# Patient Record
Sex: Female | Born: 1935
Health system: Southern US, Community
[De-identification: ages and names within clinical notes are randomized; demographics above are authoritative.]

## PROBLEM LIST (undated history)

## (undated) DIAGNOSIS — E039 Hypothyroidism, unspecified: Secondary | ICD-10-CM

## (undated) DIAGNOSIS — G4700 Insomnia, unspecified: Secondary | ICD-10-CM

## (undated) DIAGNOSIS — M810 Age-related osteoporosis without current pathological fracture: Secondary | ICD-10-CM

## (undated) DIAGNOSIS — I10 Essential (primary) hypertension: Secondary | ICD-10-CM

## (undated) DIAGNOSIS — D649 Anemia, unspecified: Secondary | ICD-10-CM

## (undated) DIAGNOSIS — I253 Aneurysm of heart: Secondary | ICD-10-CM

## (undated) DIAGNOSIS — R7303 Prediabetes: Secondary | ICD-10-CM

## (undated) DIAGNOSIS — M1712 Unilateral primary osteoarthritis, left knee: Secondary | ICD-10-CM

## (undated) DIAGNOSIS — Z853 Personal history of malignant neoplasm of breast: Secondary | ICD-10-CM

## (undated) DIAGNOSIS — R269 Unspecified abnormalities of gait and mobility: Secondary | ICD-10-CM

## (undated) DIAGNOSIS — C50919 Malignant neoplasm of unspecified site of unspecified female breast: Principal | ICD-10-CM

## (undated) DIAGNOSIS — D34 Benign neoplasm of thyroid gland: Secondary | ICD-10-CM

## (undated) HISTORY — PX: HIP SURGERY: SHX245

## (undated) HISTORY — DX: Unspecified abnormalities of gait and mobility: R26.9

## (undated) HISTORY — DX: Benign neoplasm of thyroid gland: D34

## (undated) HISTORY — DX: Personal history of malignant neoplasm of breast: Z85.3

## (undated) HISTORY — PX: CATARACT EXTRACTION, BILATERAL: SHX1313

## (undated) HISTORY — DX: Age-related osteoporosis without current pathological fracture: M81.0

## (undated) HISTORY — DX: Hypothyroidism, unspecified: E03.9

## (undated) HISTORY — PX: BREAST SURGERY: SHX581

## (undated) HISTORY — DX: Anemia, unspecified: D64.9

## (undated) HISTORY — DX: Insomnia, unspecified: G47.00

## (undated) HISTORY — DX: Prediabetes: R73.03

## (undated) HISTORY — PX: MASTECTOMY: SHX3

## (undated) HISTORY — DX: Unilateral primary osteoarthritis, left knee: M17.12

## (undated) HISTORY — DX: Aneurysm of heart: I25.3

## (undated) HISTORY — PX: OTHER SURGICAL HISTORY: SHX169

## (undated) HISTORY — DX: Essential (primary) hypertension: I10

## (undated) HISTORY — PX: MOUTH SURGERY: SHX715

## (undated) HISTORY — DX: Malignant neoplasm of unspecified site of unspecified female breast: C50.919

---

## 1999-09-26 ENCOUNTER — Encounter: Payer: Self-pay | Admitting: Obstetrics and Gynecology

## 1999-09-26 ENCOUNTER — Encounter: Admission: RE | Admit: 1999-09-26 | Discharge: 1999-09-26 | Payer: Self-pay | Admitting: Obstetrics and Gynecology

## 2000-03-27 ENCOUNTER — Ambulatory Visit (HOSPITAL_COMMUNITY): Admission: RE | Admit: 2000-03-27 | Discharge: 2000-03-27 | Payer: Self-pay | Admitting: Gastroenterology

## 2000-03-27 ENCOUNTER — Encounter (INDEPENDENT_AMBULATORY_CARE_PROVIDER_SITE_OTHER): Payer: Self-pay | Admitting: *Deleted

## 2000-09-26 ENCOUNTER — Encounter: Payer: Self-pay | Admitting: Obstetrics and Gynecology

## 2000-09-26 ENCOUNTER — Encounter: Admission: RE | Admit: 2000-09-26 | Discharge: 2000-09-26 | Payer: Self-pay | Admitting: Obstetrics and Gynecology

## 2001-10-06 ENCOUNTER — Encounter: Payer: Self-pay | Admitting: Obstetrics and Gynecology

## 2001-10-06 ENCOUNTER — Encounter: Admission: RE | Admit: 2001-10-06 | Discharge: 2001-10-06 | Payer: Self-pay | Admitting: Obstetrics and Gynecology

## 2002-10-07 ENCOUNTER — Encounter: Payer: Self-pay | Admitting: Internal Medicine

## 2002-10-07 ENCOUNTER — Encounter: Admission: RE | Admit: 2002-10-07 | Discharge: 2002-10-07 | Payer: Self-pay | Admitting: Internal Medicine

## 2003-10-18 ENCOUNTER — Encounter: Admission: RE | Admit: 2003-10-18 | Discharge: 2003-10-18 | Payer: Self-pay | Admitting: Obstetrics and Gynecology

## 2003-10-22 ENCOUNTER — Encounter: Admission: RE | Admit: 2003-10-22 | Discharge: 2003-10-22 | Payer: Self-pay | Admitting: Obstetrics and Gynecology

## 2004-06-16 ENCOUNTER — Encounter: Admission: RE | Admit: 2004-06-16 | Discharge: 2004-06-16 | Payer: Self-pay | Admitting: Internal Medicine

## 2004-08-03 ENCOUNTER — Encounter (INDEPENDENT_AMBULATORY_CARE_PROVIDER_SITE_OTHER): Payer: Self-pay | Admitting: *Deleted

## 2004-08-03 ENCOUNTER — Encounter: Admission: RE | Admit: 2004-08-03 | Discharge: 2004-08-03 | Payer: Self-pay | Admitting: Obstetrics and Gynecology

## 2004-08-03 ENCOUNTER — Encounter (INDEPENDENT_AMBULATORY_CARE_PROVIDER_SITE_OTHER): Payer: Self-pay | Admitting: Diagnostic Radiology

## 2004-08-17 ENCOUNTER — Encounter: Admission: RE | Admit: 2004-08-17 | Discharge: 2004-08-17 | Payer: Self-pay

## 2004-10-08 DIAGNOSIS — C50919 Malignant neoplasm of unspecified site of unspecified female breast: Secondary | ICD-10-CM

## 2004-10-08 DIAGNOSIS — Z853 Personal history of malignant neoplasm of breast: Secondary | ICD-10-CM

## 2004-10-08 HISTORY — DX: Personal history of malignant neoplasm of breast: Z85.3

## 2004-10-08 HISTORY — DX: Malignant neoplasm of unspecified site of unspecified female breast: C50.919

## 2005-03-27 ENCOUNTER — Ambulatory Visit (HOSPITAL_COMMUNITY): Admission: RE | Admit: 2005-03-27 | Discharge: 2005-03-27 | Payer: Self-pay | Admitting: Gastroenterology

## 2005-03-27 ENCOUNTER — Encounter (INDEPENDENT_AMBULATORY_CARE_PROVIDER_SITE_OTHER): Payer: Self-pay | Admitting: *Deleted

## 2007-08-06 ENCOUNTER — Encounter: Admission: RE | Admit: 2007-08-06 | Discharge: 2007-08-06 | Payer: Self-pay | Admitting: Internal Medicine

## 2007-10-09 DIAGNOSIS — E039 Hypothyroidism, unspecified: Secondary | ICD-10-CM

## 2007-10-09 DIAGNOSIS — D34 Benign neoplasm of thyroid gland: Secondary | ICD-10-CM

## 2007-10-09 HISTORY — DX: Hypothyroidism, unspecified: E03.9

## 2007-10-09 HISTORY — DX: Benign neoplasm of thyroid gland: D34

## 2008-04-22 ENCOUNTER — Encounter: Admission: RE | Admit: 2008-04-22 | Discharge: 2008-04-22 | Payer: Self-pay | Admitting: Internal Medicine

## 2008-06-15 ENCOUNTER — Other Ambulatory Visit: Admission: RE | Admit: 2008-06-15 | Discharge: 2008-06-15 | Payer: Self-pay | Admitting: Interventional Radiology

## 2008-06-15 ENCOUNTER — Encounter (INDEPENDENT_AMBULATORY_CARE_PROVIDER_SITE_OTHER): Payer: Self-pay | Admitting: Interventional Radiology

## 2008-06-15 ENCOUNTER — Encounter: Admission: RE | Admit: 2008-06-15 | Discharge: 2008-06-15 | Payer: Self-pay | Admitting: Endocrinology

## 2008-10-13 ENCOUNTER — Encounter: Admission: RE | Admit: 2008-10-13 | Discharge: 2008-10-13 | Payer: Self-pay | Admitting: Endocrinology

## 2009-10-12 ENCOUNTER — Encounter: Admission: RE | Admit: 2009-10-12 | Discharge: 2009-10-12 | Payer: Self-pay | Admitting: Endocrinology

## 2010-10-28 ENCOUNTER — Encounter: Payer: Self-pay | Admitting: Surgery

## 2010-11-22 ENCOUNTER — Other Ambulatory Visit: Payer: Self-pay | Admitting: Endocrinology

## 2010-11-22 DIAGNOSIS — E049 Nontoxic goiter, unspecified: Secondary | ICD-10-CM

## 2010-11-29 ENCOUNTER — Ambulatory Visit
Admission: RE | Admit: 2010-11-29 | Discharge: 2010-11-29 | Disposition: A | Discharge status: Home / Self Care | Payer: BC Managed Care – PPO | Source: Ambulatory Visit | Attending: Endocrinology | Admitting: Endocrinology

## 2010-11-29 DIAGNOSIS — E049 Nontoxic goiter, unspecified: Secondary | ICD-10-CM

## 2011-02-23 NOTE — Procedures (Signed)
[facility]  Patient:    Gwendolyn Rowe, Gwendolyn Rowe                         MRN: [id number] Proc. Date: 03/27/00 Adm. Date:  [id number] Disc. Date: [id number] Attending:  Nelda Marseille CC:         Petra Kuba, M.D.             Soyla Murphy. Renne Crigler, M.D.             Katherine Roan, M.D.                           Procedure Report  PROCEDURE:  Colonoscopy with biopsy.  INDICATIONS:  Patient with a family history of colon cancer due for colonic screening.  INFORMED CONSENT:  Consent was signed after risk, benefits, methods and options were thoroughly discussed multiple times in the past.  MEDICINES USED:  Demerol 70 mg, Versed 6 mg.  DESCRIPTION OF PROCEDURE:  Rectal inspection is pertinent for small external hemorrhoids.  Digital examination was negative.  The video pediatric colonoscope was inserted, and with some difficulty due to a tortuous colon we were able to advance to the cecum.  This did require some abdominal pressure but no position changes.  The cecum was identified by the appendiceal orifice and the ileocecal valve.  In fact the scope was inserted a shot ways in the terminal which was normal.  Further augmentation was obtained.  The scope was slowly withdrawn.  The prep was adequate.  There was some liquid stool and that required washing and suctioning.  On slow withdrawal through the colon other than a tiny questionable polyp at around the proximal level of the hepatic flexure which was cold biopsied x 1, no other abnormalities were seen as we slowly withdrew to the rectum.  When we fell back around a tortuous loop, we did try to re-advance around the curves to try to decrease the chance of missing things.  The most tortuous areas were at the flexures, mid transverse and the splenic descending junction.  Once back in the rectum the scope was in retroflex pertinent for some internal hemorrhoids.  The scope was straightened, readvanced a short  way up the sigmoid.  Air was suctioned and scope removed.  The patient tolerated the procedure well.  There was no obvious immediate complications.  ENDOSCOPIC DIAGNOSIS: 1. Internal and external hemorrhoids. 2. Tortuous colon. 3. Questionable tiny hepatic flexure polyp, status post cold biopsy. 4. Otherwise within normal limits to the terminal ileum.  PLAN:  Yearly rectal and guaiacs per Dr. Renne Crigler or Elana Alm.  Happy to see back sooner, p.r.n., otherwise repeat screening in five year.  May consider a one time BE or a virtual colonoscopy if available or even the video capsule if available based on her tortuosity, otherwise would proceed with commissural screening. DD:  03/27/00 TD:  03/30/00 Job: [id number]

## 2011-02-23 NOTE — Op Note (Signed)
Gwendolyn Rowe, Gwendolyn Rowe                ACCOUNT NO.:  [id number]   MEDICAL RECORD NO.:  [id number]          PATIENT TYPE:  AMB   LOCATION:  ENDO                         FACILITY:  [facility]   PHYSICIAN:  Petra Kuba, M.D.    DATE OF BIRTH:  [birth year]   DATE OF PROCEDURE:  03/27/2005  DATE OF DISCHARGE:                                 OPERATIVE REPORT   PROCEDURE:  Colonoscopy with hot biopsy.   INDICATIONS FOR PROCEDURE:  Family history of colon cancer, due for repeat  screening.  Consent was signed after risks, benefits and options thoroughly  discussed in the office in the past.   MEDICATIONS:  Demerol 60, Versed 8.   DESCRIPTION OF PROCEDURE:  Rectal inspection is pertinent for external  hemorrhoids, small.  Digital exam was negative.  Video pediatric adjustable  colonoscope was inserted, in spite of tortuous colon with abdominal pressure  was able to be advanced to the cecum.  No abnormalities were seen on  insertion.  Cecum was identified by the appendiceal orifice and ileocecal  valve.  Scope was slowly withdrawn.  The prep was adequate.  There was some  liquid stool that required washing and suctioning.  On slow withdrawal  through the colon other than a tortuous colon.  No abnormalities were seen  as we slowly withdrew back to the rectum.  In the proximal rectum, a tiny  polyp was seen and was hot biopsied x2 and put in a container.  Anorectal  pull through and retroflexion in the rectum confirmed some small  hemorrhoids.  Scope was straightened and readvanced a short ways up the left  side of the colon.  Air was suctioned and scope removed.  The patient  tolerated the procedure well.  There was no obvious immediate complications.   ENDOSCOPIC DIAGNOSES:  1.  Internal and external small hemorrhoids.  2.  Tiny rectal polyp hot biopsied.  3.  Otherwise within normal limits to the cecum.   PLAN:  Await pathology, probably recheck colon screening in five years.  If  widely  available and covered by insurance, a one time virtual colonoscopy at  that junction may be indicated and I would be happy to see back p.r.n.,  otherwise return care to Dr. Renne Crigler and Dr. Elana Alm for the customary  healthcare maintenance to include yearly rectals and guaiacs.       MEM/MEDQ  D:  03/27/2005  T:  03/27/2005  Job:  [id number]   cc:   Soyla Murphy. Renne Crigler, M.D.  [address]  [place]  [place] [address]  Fax: [phone]

## 2011-06-21 ENCOUNTER — Other Ambulatory Visit (HOSPITAL_COMMUNITY): Payer: Self-pay | Admitting: Internal Medicine

## 2011-06-21 DIAGNOSIS — Z1231 Encounter for screening mammogram for malignant neoplasm of breast: Secondary | ICD-10-CM

## 2011-07-02 ENCOUNTER — Other Ambulatory Visit (HOSPITAL_COMMUNITY): Payer: Self-pay | Admitting: Internal Medicine

## 2011-07-02 ENCOUNTER — Ambulatory Visit (HOSPITAL_COMMUNITY)
Admission: RE | Admit: 2011-07-02 | Discharge: 2011-07-02 | Disposition: A | Discharge status: Home / Self Care | Payer: Medicare Other | Source: Ambulatory Visit | Attending: Internal Medicine | Admitting: Internal Medicine

## 2011-07-02 DIAGNOSIS — Z1231 Encounter for screening mammogram for malignant neoplasm of breast: Secondary | ICD-10-CM

## 2011-07-31 ENCOUNTER — Other Ambulatory Visit: Payer: Self-pay | Admitting: Internal Medicine

## 2011-07-31 DIAGNOSIS — E041 Nontoxic single thyroid nodule: Secondary | ICD-10-CM

## 2011-11-12 ENCOUNTER — Ambulatory Visit
Admission: RE | Admit: 2011-11-12 | Discharge: 2011-11-12 | Disposition: A | Discharge status: Home / Self Care | Payer: Medicare Other | Source: Ambulatory Visit | Attending: Internal Medicine | Admitting: Internal Medicine

## 2011-11-12 DIAGNOSIS — E041 Nontoxic single thyroid nodule: Secondary | ICD-10-CM

## 2011-11-19 ENCOUNTER — Other Ambulatory Visit: Payer: BC Managed Care – PPO

## 2011-12-03 ENCOUNTER — Other Ambulatory Visit: Payer: BC Managed Care – PPO

## 2012-05-28 ENCOUNTER — Other Ambulatory Visit (HOSPITAL_COMMUNITY): Payer: Self-pay | Admitting: Internal Medicine

## 2012-05-28 DIAGNOSIS — Z1231 Encounter for screening mammogram for malignant neoplasm of breast: Secondary | ICD-10-CM

## 2012-07-03 ENCOUNTER — Other Ambulatory Visit (HOSPITAL_COMMUNITY): Payer: Self-pay | Admitting: Internal Medicine

## 2012-07-03 ENCOUNTER — Ambulatory Visit (HOSPITAL_COMMUNITY)
Admission: RE | Admit: 2012-07-03 | Discharge: 2012-07-03 | Disposition: A | Discharge status: Home / Self Care | Payer: Medicare Other | Source: Ambulatory Visit | Attending: Internal Medicine | Admitting: Internal Medicine

## 2012-07-03 DIAGNOSIS — Z1231 Encounter for screening mammogram for malignant neoplasm of breast: Secondary | ICD-10-CM | POA: Insufficient documentation

## 2012-07-17 ENCOUNTER — Encounter: Payer: Self-pay | Admitting: Gynecology

## 2012-07-17 ENCOUNTER — Ambulatory Visit (INDEPENDENT_AMBULATORY_CARE_PROVIDER_SITE_OTHER): Payer: Medicare Other | Admitting: Gynecology

## 2012-07-17 VITALS — BP 110/70 | Ht 66.0 in | Wt 144.0 lb

## 2012-07-17 DIAGNOSIS — M810 Age-related osteoporosis without current pathological fracture: Secondary | ICD-10-CM

## 2012-07-17 DIAGNOSIS — C50919 Malignant neoplasm of unspecified site of unspecified female breast: Secondary | ICD-10-CM

## 2012-07-17 DIAGNOSIS — N952 Postmenopausal atrophic vaginitis: Secondary | ICD-10-CM

## 2012-07-17 NOTE — Progress Notes (Signed)
Gwendolyn Rowe [birth year] [id number]        78 y.o.  G2P2 for follow up exam.  Several issues noted below.  Past medical history,surgical history, medications, allergies, family history and social history were all reviewed and documented in the EPIC chart. ROS:  Was performed and pertinent positives and negatives are included in the history.  Exam: Sherrilyn Rist assistant Filed Vitals:   07/17/12 1413  BP: 110/70  Height: 5\' 6"  (1.676 m)  Weight: 144 lb (65.318 kg)   General appearance  Normal Skin grossly normal Head/Neck normal with no cervical or supraclavicular adenopathy thyroid normal Lungs  clear Cardiac RR, without RMG Abdominal  soft, nontender, without masses, organomegaly or hernia Left Breast  examined lying and sitting without masses, retractions, discharge or axillary adenopathy. Right chest wall status post mastectomy with well-healed scars. No masses retractions adenopathy. Pelvic  Ext/BUS/vagina  normal atrophic genital changes  Cervix  normal atrophic  Uterus  axial, normal size, shape and contour, midline and mobile nontender   Adnexa  Without masses or tenderness    Anus and perineum  normal   Rectovaginal  normal sphincter tone without palpated masses or tenderness.    Assessment/Plan:  78 y.o. G2P2 female for follow up exam.   1. Breast cancer. Status post right mastectomy. Doing well with normal exam. Last mammogram 9 2013.  Continue with screening mammography. SBE monthly reviewed. 2. Atrophic vaginitis. Patient asymptomatic. We'll continue to monitor. 3. Pap smear. No Pap smear done today.  Last Pap smear 2012. No history of abnormal Pap smears before. I reviewed her chart that she brought with her from Dr. Elana Alm and she is always had normal Pap smears. Discussed current screening guidelines and will stop screening. 4. Colonoscopy. Patient had colonoscopy 2011. His to follow up at 5 year interval. 5. Osteoporosis. Had been on Fosamax previously and then  subsequently [facility] for 2 years. Had DEXA earlier this year and follows up with Dr. Renne Crigler in reference to this. 6. Health maintenance. Reviewed records from Dr. Lelon Perla that she brought with her and returned them to the patient to store.  No lab work done today as is all done through Dr. Carolee Rota office who follows her for her hypertension and hypothyroidism.  Follow up one year, sooner as needed.    Dara Lords MD, 2:59 PM 07/17/2012

## 2012-07-17 NOTE — Patient Instructions (Signed)
Follow up in one year for annual exam 

## 2012-11-19 ENCOUNTER — Other Ambulatory Visit: Payer: Self-pay | Admitting: Endocrinology

## 2012-11-19 DIAGNOSIS — E049 Nontoxic goiter, unspecified: Secondary | ICD-10-CM

## 2012-11-22 ENCOUNTER — Other Ambulatory Visit: Payer: Self-pay

## 2012-11-26 ENCOUNTER — Ambulatory Visit
Admission: RE | Admit: 2012-11-26 | Discharge: 2012-11-26 | Disposition: A | Discharge status: Home / Self Care | Payer: Medicare Other | Source: Ambulatory Visit | Attending: Endocrinology | Admitting: Endocrinology

## 2012-11-26 DIAGNOSIS — E049 Nontoxic goiter, unspecified: Secondary | ICD-10-CM

## 2013-06-01 ENCOUNTER — Other Ambulatory Visit (HOSPITAL_COMMUNITY): Payer: Self-pay | Admitting: Internal Medicine

## 2013-06-01 DIAGNOSIS — Z1231 Encounter for screening mammogram for malignant neoplasm of breast: Secondary | ICD-10-CM

## 2013-07-06 ENCOUNTER — Ambulatory Visit (HOSPITAL_COMMUNITY)
Admission: RE | Admit: 2013-07-06 | Discharge: 2013-07-06 | Disposition: A | Discharge status: Home / Self Care | Payer: Medicare Other | Source: Ambulatory Visit | Attending: Internal Medicine | Admitting: Internal Medicine

## 2013-07-06 ENCOUNTER — Other Ambulatory Visit (HOSPITAL_COMMUNITY): Payer: Self-pay | Admitting: Internal Medicine

## 2013-07-06 DIAGNOSIS — Z1231 Encounter for screening mammogram for malignant neoplasm of breast: Secondary | ICD-10-CM | POA: Insufficient documentation

## 2013-07-21 ENCOUNTER — Encounter: Payer: Self-pay | Admitting: Gynecology

## 2013-07-21 ENCOUNTER — Ambulatory Visit (INDEPENDENT_AMBULATORY_CARE_PROVIDER_SITE_OTHER): Payer: Medicare Other | Admitting: Gynecology

## 2013-07-21 VITALS — BP 130/84 | Ht 66.0 in | Wt 142.0 lb

## 2013-07-21 DIAGNOSIS — N952 Postmenopausal atrophic vaginitis: Secondary | ICD-10-CM

## 2013-07-21 DIAGNOSIS — M810 Age-related osteoporosis without current pathological fracture: Secondary | ICD-10-CM

## 2013-07-21 NOTE — Progress Notes (Signed)
Gwendolyn Rowe [birth year] [id number]        79 y.o.  G2P2002 for followup exam.  Several issues noted below.  Past medical history,surgical history, medications, allergies, family history and social history were all reviewed and documented in the EPIC chart.  ROS:  Performed and pertinent positives and negatives are included in the history, assessment and plan .  Exam: Kim assistant Filed Vitals:   07/21/13 0903  BP: 130/84  Height: 5\' 6"  (1.676 m)  Weight: 142 lb (64.411 kg)   General appearance  Normal Skin grossly normal Head/Neck normal with no cervical or supraclavicular adenopathy thyroid normal Lungs  clear Cardiac RR, without RMG Abdominal  soft, nontender, without masses, organomegaly or hernia Breast/chest wall  examined lying and sitting. Left without masses, retractions, discharge or axillary adenopathy. Right status post mastectomy without masses, adenopathy or acute changes. Pelvic  Ext/BUS/vagina  normal with atrophic changes  Cervix  normal with atrophic changes  Uterus  anteverted, normal size, shape and contour, midline and mobile nontender   Adnexa  Without masses or tenderness    Anus and perineum  normal   Rectovaginal  normal sphincter tone without palpated masses or tenderness.    Assessment/Plan:  79 y.o. G73P2002 female for annual exam.   1. Postmenopausal/atrophic genital changes. Patient without symptoms and is not currently sexually active. Will continue to monitor. 2. Breast cancer 2006 status post right mastectomy. NED. Mammography 06/2013 Continue with annual mammography. SBE monthly reviewed. 3. Pap smear 2012. No Pap smear done today. No history of abnormal Pap smears previously. Per current screening guidelines we've elected to stop screening and she is comfortable with this. 4. Osteoporosis. Actively being followed by Dr. Renne Crigler status post course of Forteo. Most recent DEXA 12/2012. We'll continue to follow up with Dr. Renne Crigler reference to  this. 5. Colonoscopy 2010. Repeat at their recommended interval. 6. Health maintenance. No lab work done as it is all done through her primary physician's office. Followup one year, sooner as needed.  Note: This document was prepared with digital dictation and possible smart phrase technology. Any transcriptional errors that result from this process are unintentional.   Dara Lords MD, 9:37 AM 07/21/2013

## 2013-07-21 NOTE — Patient Instructions (Signed)
Follow up in one year, sooner as needed. 

## 2013-08-19 ENCOUNTER — Ambulatory Visit (INDEPENDENT_AMBULATORY_CARE_PROVIDER_SITE_OTHER): Payer: Medicare Other | Admitting: Sports Medicine

## 2013-08-19 ENCOUNTER — Encounter: Payer: Self-pay | Admitting: Sports Medicine

## 2013-08-19 VITALS — BP 163/89 | Ht 66.0 in | Wt 142.0 lb

## 2013-08-19 DIAGNOSIS — M25569 Pain in unspecified knee: Secondary | ICD-10-CM

## 2013-08-19 DIAGNOSIS — M25469 Effusion, unspecified knee: Secondary | ICD-10-CM

## 2013-08-19 DIAGNOSIS — M25462 Effusion, left knee: Secondary | ICD-10-CM

## 2013-08-19 DIAGNOSIS — M4120 Other idiopathic scoliosis, site unspecified: Secondary | ICD-10-CM | POA: Insufficient documentation

## 2013-08-19 DIAGNOSIS — I10 Essential (primary) hypertension: Secondary | ICD-10-CM | POA: Insufficient documentation

## 2013-08-19 DIAGNOSIS — M2170 Unequal limb length (acquired), unspecified site: Secondary | ICD-10-CM | POA: Insufficient documentation

## 2013-08-19 DIAGNOSIS — G8929 Other chronic pain: Secondary | ICD-10-CM

## 2013-08-19 DIAGNOSIS — E039 Hypothyroidism, unspecified: Secondary | ICD-10-CM | POA: Insufficient documentation

## 2013-08-19 DIAGNOSIS — R7303 Prediabetes: Secondary | ICD-10-CM | POA: Insufficient documentation

## 2013-08-19 DIAGNOSIS — M25562 Pain in left knee: Secondary | ICD-10-CM | POA: Insufficient documentation

## 2013-08-19 MED ORDER — MELOXICAM 15 MG PO TABS: 15.0000 mg | ORAL_TABLET | Freq: Every day | ORAL | Status: DC | PRN

## 2013-08-19 NOTE — Patient Instructions (Signed)
Thank you for coming in today  Try knee compression sleeve during exercise and for 30-60 minutes after Do hip exercises daily Ice the knee after walking Try the heel lift in your shoes   Followup 1 month

## 2013-08-19 NOTE — Progress Notes (Signed)
CC: Left knee pain HPI: Patient is a very pleasant 79 year old female who is much younger than stated age who presents for evaluation of left knee pain that has been present for several months. She exercises been better for the last 3 weeks. Pain is over the medial aspect of her knee and is largely sore. Sometimes the pain gets worse. She feels very stiff after she has been sitting for while and has difficulty getting moving. She denies any swelling in the knee. Sometimes she feels like she gets a cramp in her lower leg. Her pain worsens after she walks for exercise and also a squats and lunges in stairs. She does okay on the elliptical and biking. She has been to have her Profen 600 mg daily. She also uses ice as needed and frequently uses heat. She finds that he is very helpful. She does have a history of scoliosis.  ROS: As above in the HPI. All other systems are stable or negative.  PMH: Hypothyroidism, high blood pressure, prediabetes, scoliosis  Social: Patient is retired. She does not smoke or drink alcohol. Family: Brother with diabetes. Mother and father with high blood pressure.  Allergies: No known drug allergies    OBJECTIVE: APPEARANCE:  Patient in no acute distress.The patient appeared well nourished and normally developed. HEENT: No scleral icterus. Conjunctiva non-injected Resp: Non labored Skin: No rash MSK:  Left Knee - Inspection normal with no erythema or effusion or obvious bony abnormalities.  - Palpation with significant tenderness at medial joint line  - ROM normal in flexion and extension. - Strength 5/5 in flexion and extension. - Ligaments with solid consistent endpoints including ACL, PCL, LCL, MCL.  - Negative Mcmurray's.  - Non painful patellar compression.  - Neurovascularly intact  - Trendelenburg gait - Leg length discrepancy with 2 cm shortening on the right - weakness on hip abduction and extension  MSK [place]: Ultrasound of the right knee was  performed in transverse and longitudinal views. Quad tendon normal in appearance. There is excessive hypoechoic signal suggestive of effusion in the suprapatellar pouch. Patellar tendon normal in appearance. Lateral joint line with some narrowing and hyperechoic calcification suggestive of osteophytes. Lateral meniscus normal in appearance. Medial joint line shows narrowing and irregularity as well as hyperechoic calcifications consistent with osteophytes. She does have an extrusion of her medial meniscus at the midportion of the medial joint line  Radiographs: Outside two-view standing x-ray of the left knee was personally reviewed today. This shows mild joint space narrowing in the medial joint line  ASSESSMENT: #1. Left knee pain secondary to degenerative tear of the medial meniscus as well as mild osteoarthritis and hip abductor weakness  PLAN: Patient was given a  Body helix knee compression sleeve to wear during and for 30 minutes after exercise. We gave her a heel lift to at her right shoe to help she was given a set of home exercises including hip abduction, hip extension, and standing hip rotation to help resolve her hip abductor and rotator weakness. She will followup with [place] in one month.

## 2013-09-17 ENCOUNTER — Ambulatory Visit: Payer: Medicare Other | Admitting: Sports Medicine

## 2013-11-20 ENCOUNTER — Other Ambulatory Visit: Payer: Self-pay | Admitting: Endocrinology

## 2013-11-20 DIAGNOSIS — E049 Nontoxic goiter, unspecified: Secondary | ICD-10-CM

## 2013-12-02 ENCOUNTER — Other Ambulatory Visit: Payer: Medicare Other

## 2014-01-07 ENCOUNTER — Other Ambulatory Visit (HOSPITAL_COMMUNITY): Payer: Self-pay | Admitting: Family Medicine

## 2014-02-15 ENCOUNTER — Other Ambulatory Visit: Payer: Medicare Other

## 2014-03-02 ENCOUNTER — Other Ambulatory Visit: Payer: Medicare Other

## 2014-03-02 ENCOUNTER — Ambulatory Visit: Payer: Medicare Other | Attending: Internal Medicine

## 2014-03-02 DIAGNOSIS — IMO0001 Reserved for inherently not codable concepts without codable children: Secondary | ICD-10-CM | POA: Insufficient documentation

## 2014-03-02 DIAGNOSIS — R5381 Other malaise: Secondary | ICD-10-CM | POA: Insufficient documentation

## 2014-03-02 DIAGNOSIS — M25539 Pain in unspecified wrist: Secondary | ICD-10-CM | POA: Insufficient documentation

## 2014-03-02 DIAGNOSIS — M25639 Stiffness of unspecified wrist, not elsewhere classified: Secondary | ICD-10-CM | POA: Insufficient documentation

## 2014-03-02 DIAGNOSIS — R262 Difficulty in walking, not elsewhere classified: Secondary | ICD-10-CM | POA: Insufficient documentation

## 2014-03-02 DIAGNOSIS — M25659 Stiffness of unspecified hip, not elsewhere classified: Secondary | ICD-10-CM | POA: Insufficient documentation

## 2014-03-03 ENCOUNTER — Ambulatory Visit: Payer: Medicare Other

## 2014-03-08 ENCOUNTER — Ambulatory Visit: Payer: Medicare Other | Attending: Internal Medicine

## 2014-03-08 DIAGNOSIS — R262 Difficulty in walking, not elsewhere classified: Secondary | ICD-10-CM | POA: Insufficient documentation

## 2014-03-08 DIAGNOSIS — R5381 Other malaise: Secondary | ICD-10-CM | POA: Insufficient documentation

## 2014-03-08 DIAGNOSIS — Z5189 Encounter for other specified aftercare: Secondary | ICD-10-CM | POA: Insufficient documentation

## 2014-03-08 DIAGNOSIS — M25639 Stiffness of unspecified wrist, not elsewhere classified: Secondary | ICD-10-CM | POA: Insufficient documentation

## 2014-03-10 ENCOUNTER — Ambulatory Visit: Payer: Medicare Other

## 2014-03-15 ENCOUNTER — Ambulatory Visit: Payer: Medicare Other

## 2014-03-17 ENCOUNTER — Ambulatory Visit: Payer: Medicare Other

## 2014-03-18 ENCOUNTER — Ambulatory Visit
Admission: RE | Admit: 2014-03-18 | Discharge: 2014-03-18 | Disposition: A | Discharge status: Home / Self Care | Payer: Medicare Other | Source: Ambulatory Visit | Attending: Endocrinology | Admitting: Endocrinology

## 2014-03-18 DIAGNOSIS — E049 Nontoxic goiter, unspecified: Secondary | ICD-10-CM

## 2014-03-19 ENCOUNTER — Ambulatory Visit: Payer: Medicare Other

## 2014-03-22 ENCOUNTER — Ambulatory Visit: Payer: Medicare Other

## 2014-03-24 ENCOUNTER — Ambulatory Visit: Payer: Medicare Other

## 2014-03-31 ENCOUNTER — Ambulatory Visit: Payer: Medicare Other

## 2014-04-06 ENCOUNTER — Ambulatory Visit: Payer: Medicare Other

## 2014-07-08 ENCOUNTER — Other Ambulatory Visit (HOSPITAL_COMMUNITY): Payer: Self-pay | Admitting: Internal Medicine

## 2014-07-08 DIAGNOSIS — Z1231 Encounter for screening mammogram for malignant neoplasm of breast: Secondary | ICD-10-CM

## 2014-07-13 ENCOUNTER — Other Ambulatory Visit (HOSPITAL_COMMUNITY): Payer: Self-pay | Admitting: Internal Medicine

## 2014-07-13 ENCOUNTER — Ambulatory Visit (HOSPITAL_COMMUNITY)
Admission: RE | Admit: 2014-07-13 | Discharge: 2014-07-13 | Disposition: A | Discharge status: Home / Self Care | Payer: Medicare Other | Source: Ambulatory Visit | Attending: Internal Medicine | Admitting: Internal Medicine

## 2014-07-13 DIAGNOSIS — Z1231 Encounter for screening mammogram for malignant neoplasm of breast: Secondary | ICD-10-CM | POA: Diagnosis not present

## 2014-07-22 ENCOUNTER — Encounter: Payer: Medicare Other | Admitting: Gynecology

## 2014-07-26 ENCOUNTER — Ambulatory Visit (INDEPENDENT_AMBULATORY_CARE_PROVIDER_SITE_OTHER): Payer: Medicare Other | Admitting: Gynecology

## 2014-07-26 ENCOUNTER — Encounter: Payer: Self-pay | Admitting: Gynecology

## 2014-07-26 VITALS — BP 120/78 | Ht 67.0 in | Wt 144.0 lb

## 2014-07-26 DIAGNOSIS — M810 Age-related osteoporosis without current pathological fracture: Secondary | ICD-10-CM

## 2014-07-26 DIAGNOSIS — N952 Postmenopausal atrophic vaginitis: Secondary | ICD-10-CM

## 2014-07-26 DIAGNOSIS — C50911 Malignant neoplasm of unspecified site of right female breast: Secondary | ICD-10-CM

## 2014-07-26 DIAGNOSIS — Z23 Encounter for immunization: Secondary | ICD-10-CM

## 2014-07-26 NOTE — Addendum Note (Signed)
Addended by: Nelva Nay on: 07/26/2014 11:03 AM   Modules accepted: Orders

## 2014-07-26 NOTE — Progress Notes (Signed)
PAMELYN BANCROFT 02-Aug-1934 [id number]        80 y.o.  G2P2002 for follow up exam. Several issues noted below.  Past medical history,surgical history, problem list, medications, allergies, family history and social history were all reviewed and documented as reviewed in the EPIC chart.  ROS:  12 system ROS performed with pertinent positives and negatives included in the history, assessment and plan.   Additional significant findings :  none   Exam: Kim Counsellor Vitals:   07/26/14 0916  BP: 120/78  Height: 5\' 7"  (1.702 m)  Weight: 144 lb (65.318 kg)   General appearance:  Normal affect, orientation and appearance. Skin: Grossly normal HEENT: Without gross lesions.  No cervical or supraclavicular adenopathy. Thyroid normal.  Lungs:  Clear without wheezing, rales or rhonchi Cardiac: RR, without RMG Abdominal:  Soft, nontender, without masses, guarding, rebound, organomegaly or hernia Breasts:  Examined lying and sitting. Left without masses, retractions, discharge or axillary adenopathy.  Right chest wall without masses or axillary adenopathy status post mastectomy with well healed scars. Pelvic:  Ext/BUS/vagina with generalized atrophic changes  Cervix with atrophic changes  Uterus anteverted, normal size, shape and contour, midline and mobile nontender   Adnexa  Without masses or tenderness    Anus and perineum  Normal   Rectovaginal  Normal sphincter tone without palpated masses or tenderness.    Assessment/Plan:  80 y.o. [id number] female for annual exam.   1. Postmenopausal/atrophic genital changes. Patient without symptoms such as hot flashes, night sweats, vaginal dryness. Is not sexually active. No vaginal bleeding. Continue to monitor. Report any vaginal bleeding. 2. Osteoporosis. Recent DEXA at Dr. Pennie Banter reportedly shows some decline in she has an appointment this week to discuss treatment options with him. Previously been on Forteo for 2 years. We'll follow up with him  in reference to this and his recommendations. 3. Breast cancer 2006 status post right mastectomy. Exam NED.  Mammography 07/2014. Continue with annual mammography. SBE monthly reviewed. 4. Colonoscopy 6-7 years ago. Repeat at their recommended interval. Will follow with Dr. Shelia Media for his recommendations. 5. Pap smear 2009. No Pap smear done today. No history of significant abnormal Pap smears. Plan no further screening per current screening guidelines she is over the age of 35 and comfortable with this. 6. Health maintenance. No routine lab work done as this is all done at Dr. Pennie Banter office. Follow up one year, sooner as needed.     Anastasio Auerbach MD, 10:17 AM 07/26/2014

## 2014-07-26 NOTE — Patient Instructions (Signed)
You may obtain a copy of any labs that were done today by logging onto MyChart as outlined in the instructions provided with your AVS (after visit summary). The office will not call with normal lab results but certainly if there are any significant abnormalities then we will contact you.   Health Maintenance, Female A healthy lifestyle and preventative care can promote health and wellness.  Maintain regular health, dental, and eye exams.  Eat a healthy diet. Foods like vegetables, fruits, whole grains, low-fat dairy products, and lean protein foods contain the nutrients you need without too many calories. Decrease your intake of foods high in solid fats, added sugars, and salt. Get information about a proper diet from your caregiver, if necessary.  Regular physical exercise is one of the most important things you can do for your health. Most adults should get at least 150 minutes of moderate-intensity exercise (any activity that increases your heart rate and causes you to sweat) each week. In addition, most adults need muscle-strengthening exercises on 2 or more days a week.   Maintain a healthy weight. The body mass index (BMI) is a screening tool to identify possible weight problems. It provides an estimate of body fat based on height and weight. Your caregiver can help determine your BMI, and can help you achieve or maintain a healthy weight. For adults 20 years and older:  A BMI below 18.5 is considered underweight.  A BMI of 18.5 to 24.9 is normal.  A BMI of 25 to 29.9 is considered overweight.  A BMI of 30 and above is considered obese.  Maintain normal blood lipids and cholesterol by exercising and minimizing your intake of saturated fat. Eat a balanced diet with plenty of fruits and vegetables. Blood tests for lipids and cholesterol should begin at age 61 and be repeated every 5 years. If your lipid or cholesterol levels are high, you are over 50, or you are a high risk for heart  disease, you may need your cholesterol levels checked more frequently.Ongoing high lipid and cholesterol levels should be treated with medicines if diet and exercise are not effective.  If you smoke, find out from your caregiver how to quit. If you do not use tobacco, do not start.  Lung cancer screening is recommended for adults aged 33 80 years who are at high risk for developing lung cancer because of a history of smoking. Yearly low-dose computed tomography (CT) is recommended for people who have at least a 30-pack-year history of smoking and are a current smoker or have quit within the past 15 years. A pack year of smoking is smoking an average of 1 pack of cigarettes a day for 1 year (for example: 1 pack a day for 30 years or 2 packs a day for 15 years). Yearly screening should continue until the smoker has stopped smoking for at least 15 years. Yearly screening should also be stopped for people who develop a health problem that would prevent them from having lung cancer treatment.  If you are pregnant, do not drink alcohol. If you are breastfeeding, be very cautious about drinking alcohol. If you are not pregnant and choose to drink alcohol, do not exceed 1 drink per day. One drink is considered to be 12 ounces (355 mL) of beer, 5 ounces (148 mL) of wine, or 1.5 ounces (44 mL) of liquor.  Avoid use of street drugs. Do not share needles with anyone. Ask for help if you need support or instructions about stopping  the use of drugs.  High blood pressure causes heart disease and increases the risk of stroke. Blood pressure should be checked at least every 1 to 2 years. Ongoing high blood pressure should be treated with medicines, if weight loss and exercise are not effective.  If you are 59 to 78 years old, ask your caregiver if you should take aspirin to prevent strokes.  Diabetes screening involves taking a blood sample to check your fasting blood sugar level. This should be done once every 3  years, after age 91, if you are within normal weight and without risk factors for diabetes. Testing should be considered at a younger age or be carried out more frequently if you are overweight and have at least 1 risk factor for diabetes.  Breast cancer screening is essential preventative care for women. You should practice "breast self-awareness." This means understanding the normal appearance and feel of your breasts and may include breast self-examination. Any changes detected, no matter how small, should be reported to a caregiver. Women in their 66s and 30s should have a clinical breast exam (CBE) by a caregiver as part of a regular health exam every 1 to 3 years. After age 101, women should have a CBE every year. Starting at age 100, women should consider having a mammogram (breast X-ray) every year. Women who have a family history of breast cancer should talk to their caregiver about genetic screening. Women at a high risk of breast cancer should talk to their caregiver about having an MRI and a mammogram every year.  Breast cancer gene (BRCA)-related cancer risk assessment is recommended for women who have family members with BRCA-related cancers. BRCA-related cancers include breast, ovarian, tubal, and peritoneal cancers. Having family members with these cancers may be associated with an increased risk for harmful changes (mutations) in the breast cancer genes BRCA1 and BRCA2. Results of the assessment will determine the need for genetic counseling and BRCA1 and BRCA2 testing.  The Pap test is a screening test for cervical cancer. Women should have a Pap test starting at age 57. Between ages 25 and 35, Pap tests should be repeated every 2 years. Beginning at age 37, you should have a Pap test every 3 years as long as the past 3 Pap tests have been normal. If you had a hysterectomy for a problem that was not cancer or a condition that could lead to cancer, then you no longer need Pap tests. If you are  between ages 50 and 76, and you have had normal Pap tests going back 10 years, you no longer need Pap tests. If you have had past treatment for cervical cancer or a condition that could lead to cancer, you need Pap tests and screening for cancer for at least 20 years after your treatment. If Pap tests have been discontinued, risk factors (such as a new sexual partner) need to be reassessed to determine if screening should be resumed. Some women have medical problems that increase the chance of getting cervical cancer. In these cases, your caregiver may recommend more frequent screening and Pap tests.  The human papillomavirus (HPV) test is an additional test that may be used for cervical cancer screening. The HPV test looks for the virus that can cause the cell changes on the cervix. The cells collected during the Pap test can be tested for HPV. The HPV test could be used to screen women aged 44 years and older, and should be used in women of any age  who have unclear Pap test results. After the age of 55, women should have HPV testing at the same frequency as a Pap test.  Colorectal cancer can be detected and often prevented. Most routine colorectal cancer screening begins at the age of 44 and continues through age 20. However, your caregiver may recommend screening at an earlier age if you have risk factors for colon cancer. On a yearly basis, your caregiver may provide home test kits to check for hidden blood in the stool. Use of a small camera at the end of a tube, to directly examine the colon (sigmoidoscopy or colonoscopy), can detect the earliest forms of colorectal cancer. Talk to your caregiver about this at age 86, when routine screening begins. Direct examination of the colon should be repeated every 5 to 10 years through age 13, unless early forms of pre-cancerous polyps or small growths are found.  Hepatitis C blood testing is recommended for all people born from [birth year] through 1963 and any  individual with known risks for hepatitis C.  Practice safe sex. Use condoms and avoid high-risk sexual practices to reduce the spread of sexually transmitted infections (STIs). Sexually active women aged 36 and younger should be checked for Chlamydia, which is a common sexually transmitted infection. Older women with new or multiple partners should also be tested for Chlamydia. Testing for other STIs is recommended if you are sexually active and at increased risk.  Osteoporosis is a disease in which the bones lose minerals and strength with aging. This can result in serious bone fractures. The risk of osteoporosis can be identified using a bone density scan. Women ages 20 and over and women at risk for fractures or osteoporosis should discuss screening with their caregivers. Ask your caregiver whether you should be taking a calcium supplement or vitamin D to reduce the rate of osteoporosis.  Menopause can be associated with physical symptoms and risks. Hormone replacement therapy is available to decrease symptoms and risks. You should talk to your caregiver about whether hormone replacement therapy is right for you.  Use sunscreen. Apply sunscreen liberally and repeatedly throughout the day. You should seek shade when your shadow is shorter than you. Protect yourself by wearing long sleeves, pants, a wide-brimmed hat, and sunglasses year round, whenever you are outdoors.  Notify your caregiver of new moles or changes in moles, especially if there is a change in shape or color. Also notify your caregiver if a mole is larger than the size of a pencil eraser.  Stay current with your immunizations. Document Released: 04/09/2011 Document Revised: 01/19/2013 Document Reviewed: 04/09/2011 [facility] Patient Information 2014 [employer].

## 2014-08-09 ENCOUNTER — Encounter: Payer: Self-pay | Admitting: Gynecology

## 2014-12-03 ENCOUNTER — Other Ambulatory Visit: Payer: Self-pay | Admitting: Endocrinology

## 2014-12-03 DIAGNOSIS — E049 Nontoxic goiter, unspecified: Secondary | ICD-10-CM

## 2014-12-07 ENCOUNTER — Ambulatory Visit
Admission: RE | Admit: 2014-12-07 | Discharge: 2014-12-07 | Disposition: A | Discharge status: Home / Self Care | Payer: PRIVATE HEALTH INSURANCE | Source: Ambulatory Visit | Attending: Endocrinology | Admitting: Endocrinology

## 2014-12-07 DIAGNOSIS — E049 Nontoxic goiter, unspecified: Secondary | ICD-10-CM

## 2014-12-14 ENCOUNTER — Other Ambulatory Visit: Payer: Self-pay | Admitting: Internal Medicine

## 2014-12-14 DIAGNOSIS — E049 Nontoxic goiter, unspecified: Secondary | ICD-10-CM

## 2014-12-28 ENCOUNTER — Ambulatory Visit
Admission: RE | Admit: 2014-12-28 | Discharge: 2014-12-28 | Disposition: A | Discharge status: Home / Self Care | Payer: PRIVATE HEALTH INSURANCE | Source: Ambulatory Visit | Attending: Internal Medicine | Admitting: Internal Medicine

## 2014-12-28 ENCOUNTER — Other Ambulatory Visit (HOSPITAL_COMMUNITY)
Admission: RE | Admit: 2014-12-28 | Discharge: 2014-12-28 | Disposition: A | Discharge status: Home / Self Care | Payer: PRIVATE HEALTH INSURANCE | Source: Ambulatory Visit | Attending: Interventional Radiology | Admitting: Interventional Radiology

## 2014-12-28 DIAGNOSIS — E049 Nontoxic goiter, unspecified: Secondary | ICD-10-CM

## 2014-12-28 DIAGNOSIS — E041 Nontoxic single thyroid nodule: Secondary | ICD-10-CM | POA: Diagnosis not present

## 2015-07-13 ENCOUNTER — Other Ambulatory Visit: Payer: Self-pay

## 2015-07-13 DIAGNOSIS — Z1231 Encounter for screening mammogram for malignant neoplasm of breast: Secondary | ICD-10-CM

## 2015-08-03 ENCOUNTER — Ambulatory Visit (INDEPENDENT_AMBULATORY_CARE_PROVIDER_SITE_OTHER): Payer: Medicare Other | Admitting: Gynecology

## 2015-08-03 ENCOUNTER — Encounter: Payer: Self-pay | Admitting: Gynecology

## 2015-08-03 VITALS — BP 136/78 | Ht 65.0 in | Wt 146.0 lb

## 2015-08-03 DIAGNOSIS — M810 Age-related osteoporosis without current pathological fracture: Secondary | ICD-10-CM | POA: Diagnosis not present

## 2015-08-03 DIAGNOSIS — Z01419 Encounter for gynecological examination (general) (routine) without abnormal findings: Secondary | ICD-10-CM | POA: Diagnosis not present

## 2015-08-03 DIAGNOSIS — C50911 Malignant neoplasm of unspecified site of right female breast: Secondary | ICD-10-CM

## 2015-08-03 DIAGNOSIS — R21 Rash and other nonspecific skin eruption: Secondary | ICD-10-CM | POA: Diagnosis not present

## 2015-08-03 DIAGNOSIS — N952 Postmenopausal atrophic vaginitis: Secondary | ICD-10-CM

## 2015-08-03 DIAGNOSIS — R350 Frequency of micturition: Secondary | ICD-10-CM

## 2015-08-03 MED ORDER — NYSTATIN-TRIAMCINOLONE 100000-0.1 UNIT/GM-% EX OINT: 1.0000 "application " | TOPICAL_OINTMENT | Freq: Two times a day (BID) | CUTANEOUS | Status: DC

## 2015-08-03 NOTE — Patient Instructions (Signed)

## 2015-08-03 NOTE — Progress Notes (Signed)
Gwendolyn Rowe Jun 21, 1934 [id number]        81 y.o.  G2P2002 for breast and pelvic exam. Several issues noted below.  Past medical history,surgical history, problem list, medications, allergies, family history and social history were all reviewed and documented as reviewed in the EPIC chart.  ROS:  Performed with pertinent positives and negatives included in the history, assessment and plan.   Additional significant findings :  none   Exam: Gwendolyn Rowe Vitals:   08/03/15 1141  BP: 136/78  Height: 5\' 5"  (1.651 m)  Weight: 146 lb (66.225 kg)   General appearance:  Normal affect, orientation and appearance. Skin: Rash under left breast consistent with fungal. HEENT: Without gross lesions.  No cervical or supraclavicular adenopathy. Thyroid normal.  Lungs:  Clear without wheezing, rales or rhonchi Cardiac: RR, without RMG Abdominal:  Soft, nontender, without masses, guarding, rebound, organomegaly or hernia Breasts:  Examined lying and sitting. Left without masses, retractions, discharge or axillary adenopathy.  Right status post mastectomy. No chest wall changes, masses or axillary adenopathy. Pelvic:  Ext/BUS/vagina with atrophic changes  Cervix with atrophic changes  Uterus anteverted, normal size, shape and contour, midline and mobile nontender   Adnexa  Without masses or tenderness    Anus and perineum  Normal   Rectovaginal  Normal sphincter tone without palpated masses or tenderness.    Assessment/Plan:  81 y.o. [id number] female for breast and pelvic exam  1. Rash under her left breast. Present for 3 weeks. Has been applying OTC creams. Is itchy if she does not do so. Appears to be fungal. Will treat with Mycolog twice a day. Patient will call if persists and I'll refer to dermatology. 2. Urinary frequency. Patient has episodes where she will void and then several minutes later feel like she has to void again. Does go a fair amount of urine when she does so. Not  consistent. No dysuria urgency or low back pain. Will check baseline urinalysis. Suspect age related with bladder fragility. Issues as far as caffeine/carbonated beverages reviewed. 3. Osteoporosis. On Prolia per Dr. Shelia Media.  She will continue to follow up with him in reference to this. 4. Postmenopausal/atrophic genital changes. Without significant hot flushes, night sweats, vaginal dryness. No vaginal bleeding. Continue to monitor and report any issues. 5. Mammography do now she has this scheduled. SBE monthly reviewed. History of right-sided breast cancer status post mastectomy. Exam is NED. 6. Pap smear 2009. No Pap smear done today. No history of significant abnormal Pap smears. We both agreed to stop screening based on current screening guidelines. 7. Health maintenance. No routine blood work done as this is done at Dr. Pennie Banter office.  Follow up 1 year, sooner as needed.   Anastasio Auerbach MD, 12:05 PM 08/03/2015

## 2015-08-04 LAB — URINALYSIS W MICROSCOPIC + REFLEX CULTURE
Bacteria, UA: NONE SEEN [HPF]
Bilirubin Urine: NEGATIVE
Casts: NONE SEEN [LPF]
Crystals: NONE SEEN [HPF]
GLUCOSE, UA: NEGATIVE
Nitrite: NEGATIVE
PH: 8 (ref 5.0–8.0)
Protein, ur: NEGATIVE
SPECIFIC GRAVITY, URINE: 1.014 (ref 1.001–1.035)
WBC, UA: NONE SEEN WBC/HPF (ref <=5)
YEAST: NONE SEEN [HPF]

## 2015-08-05 LAB — URINE CULTURE: Colony Count: >=100000

## 2015-08-09 ENCOUNTER — Ambulatory Visit
Admission: RE | Admit: 2015-08-09 | Discharge: 2015-08-09 | Disposition: A | Discharge status: Home / Self Care | Payer: Medicare Other | Source: Ambulatory Visit

## 2015-08-09 DIAGNOSIS — Z1231 Encounter for screening mammogram for malignant neoplasm of breast: Secondary | ICD-10-CM

## 2015-12-05 ENCOUNTER — Other Ambulatory Visit: Payer: Self-pay | Admitting: Endocrinology

## 2015-12-05 DIAGNOSIS — E049 Nontoxic goiter, unspecified: Secondary | ICD-10-CM

## 2015-12-13 ENCOUNTER — Ambulatory Visit
Admission: RE | Admit: 2015-12-13 | Discharge: 2015-12-13 | Disposition: A | Discharge status: Home / Self Care | Payer: Medicare Other | Source: Ambulatory Visit | Attending: Endocrinology | Admitting: Endocrinology

## 2015-12-13 DIAGNOSIS — E049 Nontoxic goiter, unspecified: Secondary | ICD-10-CM

## 2016-07-26 ENCOUNTER — Other Ambulatory Visit: Payer: Self-pay | Admitting: Internal Medicine

## 2016-07-26 DIAGNOSIS — Z1231 Encounter for screening mammogram for malignant neoplasm of breast: Secondary | ICD-10-CM

## 2016-08-03 ENCOUNTER — Encounter: Payer: Self-pay | Admitting: Gynecology

## 2016-08-03 ENCOUNTER — Ambulatory Visit (INDEPENDENT_AMBULATORY_CARE_PROVIDER_SITE_OTHER): Payer: Medicare Other | Admitting: Gynecology

## 2016-08-03 VITALS — BP 120/74 | Ht 65.0 in | Wt 148.0 lb

## 2016-08-03 DIAGNOSIS — N952 Postmenopausal atrophic vaginitis: Secondary | ICD-10-CM | POA: Diagnosis not present

## 2016-08-03 DIAGNOSIS — M810 Age-related osteoporosis without current pathological fracture: Secondary | ICD-10-CM

## 2016-08-03 DIAGNOSIS — Z01411 Encounter for gynecological examination (general) (routine) with abnormal findings: Secondary | ICD-10-CM | POA: Diagnosis not present

## 2016-08-03 DIAGNOSIS — C50911 Malignant neoplasm of unspecified site of right female breast: Secondary | ICD-10-CM

## 2016-08-03 NOTE — Progress Notes (Signed)
    Gwendolyn Rowe 04/04/1934 [id number]        82 y.o.  G2P2002  for annual exam.  Doing well without complaints.  Past medical history,surgical history, problem list, medications, allergies, family history and social history were all reviewed and documented as reviewed in the EPIC chart.  ROS:  Performed with pertinent positives and negatives included in the history, assessment and plan.   Additional significant findings :  None   Exam: [name] assistant Vitals:   08/03/16 1149  BP: 120/74  Weight: 148 lb (67.1 kg)  Height: 5\' 5"  (1.651 m)   Body mass index is 24.63 kg/m.  General appearance:  Normal affect, orientation and appearance. Skin: Grossly normal HEENT: Without gross lesions.  No cervical or supraclavicular adenopathy. Thyroid normal.  Lungs:  Clear without wheezing, rales or rhonchi Cardiac: RR, without RMG Abdominal:  Soft, nontender, without masses, guarding, rebound, organomegaly or hernia Breasts:  Examined lying and sitting. Left without masses, retractions, discharge or axillary adenopathy. Right status post mastectomy with no chest wall masses or axillary adenopathy Pelvic:  Ext, BUS, Vagina with atrophic changes  Cervix with atrophic changes  Uterus anteverted, normal size, shape and contour, midline and mobile nontender   Adnexa without masses or tenderness    Anus and perineum normal   Rectovaginal normal sphincter tone without palpated masses or tenderness.    Assessment/Plan:  82 y.o. [id number] female for annual exam.   1. Postmenopausal/atrophic genital changes. Doing well without significant hot flushes, night sweats, vaginal dryness or any vaginal bleeding. Continue monitor report any issues or bleeding. 2. History of right breast cancer. Exam NED. Mammography coming due next month and patient will schedule. SBE monthly reviewed. 3. Pap smear 2009. No Pap smear done today. No history of significant abnormal Pap smears. Discussed current screening  guidelines we both agree to stop screening based on age. 4. Colonoscopy 9 years ago with upcoming colonoscopy next year. 5. DEXA 2015. I do not have copies of these. Being followed for osteoporosis by Dr. Shelia Media on [place]. Will continue to follow up with him in reference to this. 6. Health maintenance. No routine lab work done as patient reports has this done at her primary physician's office. Follow up 1 year, sooner as needed.   Anastasio Auerbach MD, 12:50 PM 08/03/2016

## 2016-08-03 NOTE — Patient Instructions (Signed)
Follow up in one year for annual exam, sooner if there are any issues.

## 2016-08-16 ENCOUNTER — Ambulatory Visit
Admission: RE | Admit: 2016-08-16 | Discharge: 2016-08-16 | Disposition: A | Discharge status: Home / Self Care | Payer: Medicare Other | Source: Ambulatory Visit | Attending: Internal Medicine | Admitting: Internal Medicine

## 2016-08-16 DIAGNOSIS — Z1231 Encounter for screening mammogram for malignant neoplasm of breast: Secondary | ICD-10-CM

## 2016-09-25 ENCOUNTER — Encounter: Payer: Self-pay | Admitting: Cardiology

## 2016-09-26 ENCOUNTER — Ambulatory Visit (INDEPENDENT_AMBULATORY_CARE_PROVIDER_SITE_OTHER): Payer: Medicare Other | Admitting: Cardiology

## 2016-09-26 ENCOUNTER — Encounter: Payer: Self-pay | Admitting: Cardiology

## 2016-09-26 VITALS — BP 152/84 | HR 76 | Ht 66.5 in | Wt 147.8 lb

## 2016-09-26 DIAGNOSIS — I10 Essential (primary) hypertension: Secondary | ICD-10-CM

## 2016-09-26 DIAGNOSIS — I253 Aneurysm of heart: Secondary | ICD-10-CM | POA: Diagnosis not present

## 2016-09-26 DIAGNOSIS — I447 Left bundle-branch block, unspecified: Secondary | ICD-10-CM | POA: Diagnosis not present

## 2016-09-26 NOTE — Patient Instructions (Signed)
SCHEDULE AT [place] has requested that you have an echocardiogram. Echocardiography is a painless test that uses sound waves to create images of your heart. It provides your doctor with information about the size and shape of your heart and how well your heart's chambers and valves are working. This procedure takes approximately one hour. There are no restrictions for this procedure.    NO OTHER CHANGES   Your physician recommends that you schedule a follow-up appointment in 1- [place].
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
## 2016-09-27 ENCOUNTER — Encounter: Payer: Self-pay | Admitting: Cardiology

## 2016-09-27 DIAGNOSIS — I253 Aneurysm of heart: Secondary | ICD-10-CM | POA: Insufficient documentation

## 2016-09-27 DIAGNOSIS — I10 Essential (primary) hypertension: Secondary | ICD-10-CM | POA: Insufficient documentation

## 2016-09-27 DIAGNOSIS — I447 Left bundle-branch block, unspecified: Secondary | ICD-10-CM | POA: Insufficient documentation

## 2016-09-27 NOTE — Assessment & Plan Note (Signed)
Not adequately controlled currently on low-dose calcium channel blocker. Depending on what the echo shows, may need additional adjustment. For now will defer to PCP.

## 2016-09-27 NOTE — Assessment & Plan Note (Addendum)
Apparently the bundle branch block with possible inferior Q waves is a new finding per her PCP.  One would expect to the septal MI with troponin echocardiogram. She has not had anything symptoms to suggest a prior MI. No heart failure either.  Plan: 2-D echocardiogram (with bubble) to look for any regional wall motion modalities. If there is only septal dyssynergy from bundle branch block, this will argue that she has not had an MI. I don't think we need to do any ischemic evaluation unless the echo is abnormal.

## 2016-09-27 NOTE — Assessment & Plan Note (Signed)
Not exactly sure how this was diagnosed. Usually these are not any significant issue.  Plan: Check 2-D echocardiogram with bubble study.

## 2016-10-17 ENCOUNTER — Ambulatory Visit (HOSPITAL_COMMUNITY): Payer: Medicare Other | Attending: Cardiovascular Disease

## 2016-10-17 ENCOUNTER — Other Ambulatory Visit: Payer: Self-pay

## 2016-10-17 DIAGNOSIS — I447 Left bundle-branch block, unspecified: Secondary | ICD-10-CM | POA: Diagnosis not present

## 2016-10-17 DIAGNOSIS — I253 Aneurysm of heart: Secondary | ICD-10-CM | POA: Diagnosis not present

## 2016-10-17 DIAGNOSIS — R9431 Abnormal electrocardiogram [ECG] [EKG]: Secondary | ICD-10-CM | POA: Diagnosis present

## 2016-11-27 ENCOUNTER — Ambulatory Visit (INDEPENDENT_AMBULATORY_CARE_PROVIDER_SITE_OTHER): Payer: Medicare Other | Admitting: Cardiology

## 2016-11-27 ENCOUNTER — Encounter: Payer: Self-pay | Admitting: Cardiology

## 2016-11-27 DIAGNOSIS — I10 Essential (primary) hypertension: Secondary | ICD-10-CM

## 2016-11-27 DIAGNOSIS — I447 Left bundle-branch block, unspecified: Secondary | ICD-10-CM

## 2016-11-27 DIAGNOSIS — I253 Aneurysm of heart: Secondary | ICD-10-CM | POA: Diagnosis not present

## 2016-11-27 MED ORDER — AMLODIPINE BESYLATE 10 MG PO TABS: 10.0000 mg | ORAL_TABLET | Freq: Every day | ORAL | 3 refills | Status: DC

## 2016-11-27 NOTE — Assessment & Plan Note (Signed)
Essentially normal 2-D echocardiogram. Oftentimes a bundle branch block which show some dyssynchrony of wall motion that was not seen on her echocardiogram. This is relatively reassuring.  Likely simply age-related. Would only monitor for signs of bradycardia.

## 2016-11-27 NOTE — Assessment & Plan Note (Signed)
Not is well-controlled today is normal expected. Also there was some diastolic dysfunction noted on the echo. She is on amlodipine. I just recommended that she increases up to 10 mg and have her blood pressure rechecked when she sees her endocrinologist next week. At this seems to have a better controlled, be recommendation for going forward. However going for to think this can be managed by her PCP.

## 2016-11-27 NOTE — Assessment & Plan Note (Signed)
Echo did show an atrial septal aneurysm, but no PFO or ASD noted. Likely normal variant
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
## 2016-11-27 NOTE — Patient Instructions (Signed)
MEDICATION CHANGES INCREASE AMLODIPINE TO 10 MG DAILY PLEASE HAVE BLOOD PRESSURE CHECK AT NEXT DOCTOR'S APPOINTMENT ,IF CONTROL ,PLEASE HAVE DR PHARR - FOLLOW UP AND WRITE NEW PRESCRIPTION.   NO OTHER CHANGES   Your physician recommends that you schedule a follow-up appointment ON AN AS NEEDED BASIS.

## 2017-02-21 ENCOUNTER — Encounter: Payer: Self-pay | Admitting: Sports Medicine

## 2017-02-21 ENCOUNTER — Ambulatory Visit (INDEPENDENT_AMBULATORY_CARE_PROVIDER_SITE_OTHER): Payer: Medicare Other | Admitting: Sports Medicine

## 2017-02-21 VITALS — BP 139/70 | Ht 66.0 in | Wt 146.0 lb

## 2017-02-21 DIAGNOSIS — G8929 Other chronic pain: Secondary | ICD-10-CM

## 2017-02-21 DIAGNOSIS — M25562 Pain in left knee: Secondary | ICD-10-CM | POA: Diagnosis not present
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
## 2017-02-21 NOTE — Assessment & Plan Note (Addendum)
Appears to be doing well with her left knee. No effusion is present at this time. Will treat conservatively. Recommended some home exercises to do to keep up the knee strength. Gave recommendations in terms of over-the-counter Tylenol or NSAIDs Follow up as needed, can consider injection at next visit.

## 2017-07-17 ENCOUNTER — Other Ambulatory Visit: Payer: Self-pay | Admitting: Internal Medicine

## 2017-07-17 DIAGNOSIS — Z1231 Encounter for screening mammogram for malignant neoplasm of breast: Secondary | ICD-10-CM

## 2017-08-13 ENCOUNTER — Encounter: Payer: Self-pay | Admitting: Gynecology

## 2017-08-13 ENCOUNTER — Ambulatory Visit: Payer: Medicare Other | Admitting: Gynecology

## 2017-08-13 VITALS — BP 134/84 | Ht 65.0 in | Wt 148.0 lb

## 2017-08-13 DIAGNOSIS — N952 Postmenopausal atrophic vaginitis: Secondary | ICD-10-CM

## 2017-08-13 DIAGNOSIS — Z853 Personal history of malignant neoplasm of breast: Secondary | ICD-10-CM

## 2017-08-13 DIAGNOSIS — Z01411 Encounter for gynecological examination (general) (routine) with abnormal findings: Secondary | ICD-10-CM | POA: Diagnosis not present

## 2017-08-13 NOTE — Patient Instructions (Signed)
Follow-up in 1 year, sooner as needed. 

## 2017-08-13 NOTE — Progress Notes (Signed)
    Gwendolyn Rowe 1934/07/21 [id number]        83 y.o.  G2P2002 for annual gynecologic exam.   Past medical history,surgical history, problem list, medications, allergies, family history and social history were all reviewed and documented as reviewed in the EPIC chart.  ROS:  Performed with pertinent positives and negatives included in the history, assessment and plan.   Additional significant findings : None   Exam: [name] assistant Vitals:   08/13/17 1029  BP: 134/84  Weight: 148 lb (67.1 kg)  Height: 5\' 5"  (1.651 m)   Body mass index is 24.63 kg/m.  General appearance:  Normal affect, orientation and appearance. Skin: Grossly normal HEENT: Without gross lesions.  No cervical or supraclavicular adenopathy. Thyroid normal.  Lungs:  Clear without wheezing, rales or rhonchi Cardiac: RR, without RMG Abdominal:  Soft, nontender, without masses, guarding, rebound, organomegaly or hernia Breasts:  Examined lying and sitting.  Left without masses, retractions, discharge or axillary adenopathy.  Right status post mastectomy.  No masses or axillary adenopathy Pelvic:  Ext, BUS, Vagina: With atrophic changes  Cervix: With atrophic changes  Uterus: Anteverted, normal size, shape and contour, midline and mobile nontender   Adnexa: Without masses or tenderness    Anus and perineum: Normal   Rectovaginal: Normal sphincter tone without palpated masses or tenderness.    Assessment/Plan:  83 y.o. G71P2002 female for annual gynecologic exam.   1. Postmenopausal/atrophic genital changes.  No significant hot flushes, night sweats, vaginal dryness or any vaginal bleeding.  We will continue to monitor and report any issues or bleeding. 2. History of right breast cancer.  Mammography scheduled later this month.  Exam NED. 3. Pap smear 2009.  No Pap smear done today.  No history of abnormal Pap smears.  We both agreed to stop screening based on age and current screening  guidelines. 4. Colonoscopy 11 years ago.  Reports doing stool checks through Dr. Pennie Banter office.  Will continue to follow-up with him in reference to: Health. 5. Osteoporosis.  Continues to be followed by Dr. Shelia Media.  Continues on Prolia.  Last bone density reported 2015.  She will continue to follow-up with him in reference to this. 6. Health maintenance.  No routine lab work done as this is done at Dr. Pennie Banter office.  Follow-up 1 year, sooner as needed.   Anastasio Auerbach MD, 11:26 AM 08/13/2017

## 2017-08-19 ENCOUNTER — Ambulatory Visit
Admission: RE | Admit: 2017-08-19 | Discharge: 2017-08-19 | Disposition: A | Discharge status: Home / Self Care | Payer: Medicare Other | Source: Ambulatory Visit | Attending: Internal Medicine | Admitting: Internal Medicine

## 2017-08-19 DIAGNOSIS — Z1231 Encounter for screening mammogram for malignant neoplasm of breast: Secondary | ICD-10-CM

## 2017-10-22 ENCOUNTER — Other Ambulatory Visit: Payer: Self-pay | Admitting: Endocrinology

## 2017-10-22 DIAGNOSIS — E049 Nontoxic goiter, unspecified: Secondary | ICD-10-CM

## 2017-11-27 ENCOUNTER — Other Ambulatory Visit: Payer: Medicare Other

## 2017-12-05 ENCOUNTER — Ambulatory Visit: Payer: Medicare Other | Admitting: Neurology

## 2017-12-16 ENCOUNTER — Ambulatory Visit: Payer: Medicare Other | Admitting: Neurology

## 2017-12-18 ENCOUNTER — Ambulatory Visit
Admission: RE | Admit: 2017-12-18 | Discharge: 2017-12-18 | Disposition: A | Discharge status: Home / Self Care | Payer: Medicare Other | Source: Ambulatory Visit | Attending: Endocrinology | Admitting: Endocrinology

## 2017-12-18 DIAGNOSIS — E049 Nontoxic goiter, unspecified: Secondary | ICD-10-CM

## 2017-12-25 ENCOUNTER — Encounter: Payer: Self-pay | Admitting: Neurology

## 2017-12-25 ENCOUNTER — Ambulatory Visit: Payer: Medicare Other | Admitting: Neurology

## 2017-12-25 VITALS — BP 152/84 | HR 72 | Ht 65.0 in | Wt 150.0 lb

## 2017-12-25 DIAGNOSIS — G2581 Restless legs syndrome: Secondary | ICD-10-CM | POA: Diagnosis not present

## 2017-12-25 DIAGNOSIS — M79674 Pain in right toe(s): Secondary | ICD-10-CM

## 2017-12-25 DIAGNOSIS — M7989 Other specified soft tissue disorders: Secondary | ICD-10-CM

## 2017-12-25 MED ORDER — GABAPENTIN 100 MG PO CAPS: 300.0000 mg | ORAL_CAPSULE | Freq: Three times a day (TID) | ORAL | 11 refills | Status: DC
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
## 2017-12-26 LAB — FERRITIN: Ferritin: 64 ng/mL (ref 15–150)

## 2017-12-26 LAB — IRON AND TIBC
IRON: 107 ug/dL (ref 27–139)
Iron Saturation: 36 % (ref 15–55)
TIBC: 297 ug/dL (ref 250–450)
UIBC: 190 ug/dL (ref 118–369)

## 2017-12-27 ENCOUNTER — Telehealth: Payer: Self-pay

## 2017-12-27 NOTE — Telephone Encounter (Signed)
-----   Message from Marcial Pacas, MD sent at 12/26/2017  3:41 PM EDT ----- Please call patient for normal laboratory result

## 2017-12-27 NOTE — Telephone Encounter (Signed)
I called and spoke with patient and made her aware of normal lab results. She voiced understanding and appreciation.  

## 2018-04-02 ENCOUNTER — Ambulatory Visit: Payer: Medicare Other | Admitting: Neurology

## 2018-04-02 ENCOUNTER — Telehealth: Payer: Self-pay | Admitting: Neurology

## 2018-04-02 ENCOUNTER — Encounter: Payer: Self-pay | Admitting: Neurology

## 2018-04-02 VITALS — BP 134/84 | HR 69 | Ht 65.0 in | Wt 146.5 lb

## 2018-04-02 DIAGNOSIS — R269 Unspecified abnormalities of gait and mobility: Secondary | ICD-10-CM | POA: Diagnosis not present

## 2018-04-02 DIAGNOSIS — G8929 Other chronic pain: Secondary | ICD-10-CM | POA: Insufficient documentation

## 2018-04-02 DIAGNOSIS — G2581 Restless legs syndrome: Secondary | ICD-10-CM

## 2018-04-02 DIAGNOSIS — M545 Low back pain, unspecified: Secondary | ICD-10-CM | POA: Insufficient documentation
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
## 2018-04-02 NOTE — Telephone Encounter (Signed)
[employer] Medicare order sent to [facility]. No auth they will reach out to the pt to schedule.  °

## 2018-04-21 ENCOUNTER — Ambulatory Visit
Admission: RE | Admit: 2018-04-21 | Discharge: 2018-04-21 | Disposition: A | Discharge status: Home / Self Care | Payer: Medicare Other | Source: Ambulatory Visit | Attending: Neurology | Admitting: Neurology

## 2018-04-21 DIAGNOSIS — G2581 Restless legs syndrome: Secondary | ICD-10-CM

## 2018-04-21 DIAGNOSIS — G8929 Other chronic pain: Secondary | ICD-10-CM

## 2018-04-21 DIAGNOSIS — M545 Low back pain: Secondary | ICD-10-CM | POA: Diagnosis not present

## 2018-04-22 ENCOUNTER — Telehealth: Payer: Self-pay | Admitting: *Deleted

## 2018-04-22 NOTE — Telephone Encounter (Signed)
-----   Message from Penni Bombard, MD sent at 04/22/2018 10:32 AM EDT ----- Multilevel degenerative changes. Slightly worse at L2-3. Recommend to discuss with Dr. Krista Blue when she returns, or consider to return to spine surgeon for further evalution. -VRP

## 2018-04-22 NOTE — Telephone Encounter (Signed)
Spoke to patient to review her MRI results.  She would like to come in for an earlier appt to further discuss with Dr. Krista Blue.  Her October appt has been canceled and she will now come on 05/14/18.

## 2018-05-14 ENCOUNTER — Ambulatory Visit: Payer: Medicare Other | Admitting: Neurology

## 2018-05-14 ENCOUNTER — Encounter: Payer: Self-pay | Admitting: Neurology

## 2018-05-14 VITALS — BP 150/78 | HR 70 | Ht 65.0 in | Wt 147.5 lb

## 2018-05-14 DIAGNOSIS — R269 Unspecified abnormalities of gait and mobility: Secondary | ICD-10-CM

## 2018-05-14 DIAGNOSIS — M545 Low back pain, unspecified: Secondary | ICD-10-CM | POA: Insufficient documentation
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
## 2018-07-07 ENCOUNTER — Other Ambulatory Visit: Payer: Self-pay

## 2018-07-07 ENCOUNTER — Ambulatory Visit: Payer: Medicare Other | Attending: Neurology | Admitting: Physical Therapy

## 2018-07-07 ENCOUNTER — Encounter: Payer: Self-pay | Admitting: Physical Therapy

## 2018-07-07 DIAGNOSIS — R2681 Unsteadiness on feet: Secondary | ICD-10-CM

## 2018-07-07 DIAGNOSIS — M6281 Muscle weakness (generalized): Secondary | ICD-10-CM | POA: Diagnosis present

## 2018-07-07 DIAGNOSIS — R262 Difficulty in walking, not elsewhere classified: Secondary | ICD-10-CM | POA: Diagnosis present
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
## 2018-07-14 ENCOUNTER — Ambulatory Visit: Payer: Medicare Other | Attending: Neurology | Admitting: Physical Therapy

## 2018-07-14 ENCOUNTER — Encounter: Payer: Self-pay | Admitting: Physical Therapy

## 2018-07-14 DIAGNOSIS — R262 Difficulty in walking, not elsewhere classified: Secondary | ICD-10-CM | POA: Insufficient documentation

## 2018-07-14 DIAGNOSIS — R2681 Unsteadiness on feet: Secondary | ICD-10-CM | POA: Diagnosis present

## 2018-07-14 DIAGNOSIS — M6281 Muscle weakness (generalized): Secondary | ICD-10-CM

## 2018-07-14 NOTE — Patient Instructions (Signed)
Access Code: [email or web address]  URL: [email or web address]  Date: 07/14/2018  Prepared by: [name]   Exercises  Sit to Stand with Resistance Around Legs - 10 reps - 1 sets - 1x daily - 5x weekly  Hooklying Single Leg Bent Knee Fallouts with Resistance - 10 reps - 1 sets - 5 hold - 1x daily - 5x weekly
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
## 2018-07-21 ENCOUNTER — Ambulatory Visit: Payer: Medicare Other | Admitting: Physical Therapy

## 2018-07-21 ENCOUNTER — Encounter: Payer: Self-pay | Admitting: Physical Therapy

## 2018-07-21 DIAGNOSIS — R2681 Unsteadiness on feet: Secondary | ICD-10-CM | POA: Diagnosis not present

## 2018-07-21 DIAGNOSIS — R262 Difficulty in walking, not elsewhere classified: Secondary | ICD-10-CM

## 2018-07-21 DIAGNOSIS — M6281 Muscle weakness (generalized): Secondary | ICD-10-CM

## 2018-07-21 NOTE — Patient Instructions (Signed)
Access Code: [email or web address]  URL: [email or web address]  Date: 07/21/2018  Prepared by: [name]   Exercises  Sit to Stand with Resistance Around Legs - 10 reps - 1 sets - 1x daily - 5x weekly  Romberg Stance Eyes Closed on Foam Pad - 3 reps - 1 sets - 30 hold - 1x daily - 5x weekly  Wide Stance with Eyes Closed and Head Rotation on Foam Pad - 10 reps - 1 sets - 1x daily - 5x weekly  Wide Stance with Eyes Closed and Head Nods on Foam Pad - 10 reps - 1 sets - 1x daily - 5x weekly  Half Tandem Stance with Eyes Closed on Foam Pad - 3 reps - 1 sets - 30 hold - 1x daily - 5x weekly
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
## 2018-07-24 ENCOUNTER — Other Ambulatory Visit: Payer: Self-pay | Admitting: Internal Medicine

## 2018-07-24 DIAGNOSIS — Z1231 Encounter for screening mammogram for malignant neoplasm of breast: Secondary | ICD-10-CM

## 2018-07-31 ENCOUNTER — Encounter: Payer: Self-pay | Admitting: Physical Therapy

## 2018-07-31 ENCOUNTER — Ambulatory Visit: Payer: Medicare Other | Admitting: Physical Therapy

## 2018-07-31 DIAGNOSIS — R2681 Unsteadiness on feet: Secondary | ICD-10-CM

## 2018-07-31 DIAGNOSIS — R262 Difficulty in walking, not elsewhere classified: Secondary | ICD-10-CM

## 2018-07-31 DIAGNOSIS — M6281 Muscle weakness (generalized): Secondary | ICD-10-CM
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
## 2018-08-04 ENCOUNTER — Ambulatory Visit: Payer: Medicare Other | Admitting: Physical Therapy

## 2018-08-04 ENCOUNTER — Encounter: Payer: Self-pay | Admitting: Physical Therapy

## 2018-08-04 ENCOUNTER — Ambulatory Visit: Payer: Medicare Other | Admitting: Neurology

## 2018-08-04 DIAGNOSIS — R2681 Unsteadiness on feet: Secondary | ICD-10-CM | POA: Diagnosis not present

## 2018-08-04 DIAGNOSIS — M6281 Muscle weakness (generalized): Secondary | ICD-10-CM

## 2018-08-04 DIAGNOSIS — R262 Difficulty in walking, not elsewhere classified: Secondary | ICD-10-CM

## 2018-08-04 NOTE — Patient Instructions (Signed)
Access Code: [email or web address]  URL: [email or web address]  Date: 08/04/2018  Prepared by: Barry Brunner   Exercises  Sit to Stand with Resistance Around Legs - 10 reps - 1 sets - 1x daily - 5x weekly  Romberg Stance Eyes Closed on Foam Pad - 3 reps - 1 sets - 30 hold - 1x daily - 5x weekly  Wide Stance with Eyes Closed and Head Rotation on Foam Pad - 10 reps - 1 sets - 1x daily - 5x weekly  Wide Stance with Eyes Closed and Head Nods on Foam Pad - 10 reps - 1 sets - 1x daily - 5x weekly  Half Tandem Stance with Eyes Closed on Foam Pad - 3 reps - 1 sets - 30 hold - 1x daily - 5x weekly   Added Side Stepping with Resistance at Thighs - 10 reps - 3 sets - 1x daily - 5x weekly  Forward Monster Walks - 10 reps - 3 sets - 1x daily - 5x weekly  Backward Monster Walks - 10 reps - 3 sets - 1x daily - 5x weekly

## 2018-08-05 ENCOUNTER — Telehealth: Payer: Self-pay | Admitting: Physical Therapy

## 2018-08-05 NOTE — Telephone Encounter (Signed)
Dr. Yan,  Thank you for referring Gwendolyn Rowe for OPPT due to gait abnormality. She was evaluated by PT on 07/07/18.  The patient would benefit from a built up shoe for her right lower extremity due to shortening compared to left leg (more than 1/2 inch difference). She was initially resistant to this idea. However after several weeks of therapy focusing on stretching and strengthening her hip muscles with little to no improvement in her left hip/thigh pain, she would like to obtain a built up shoe. A built up shoe will improve her safety with ambulation and could potentially help with her hip/thigh pain,  For insurance to cover a built up shoe, she will need a prescription/order from her physician, "Orthotist consult for right built up shoe due to RLE more than 1/2 inch shorter than LLE." Insurance will also require documentation of need for the built up shoe in your progress note. You can either addend your note of 05/14/18 to include the information re: leg length discrepancy, or if you prefer you could have [name] come in for another appointment for you to further assess her.    If you agree, please place an order in [facility] in [facility] or fax the order to [phone]. I can then provide her with the prescription and information on the local orthotists she can choose from.  Thank you,   Selassie Spatafore, PT [facility] [address], [address] [place], [place] [address] [phone]  
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
## 2018-08-11 ENCOUNTER — Encounter: Payer: Self-pay | Admitting: Physical Therapy

## 2018-08-11 ENCOUNTER — Ambulatory Visit: Payer: Medicare Other | Attending: Neurology | Admitting: Physical Therapy

## 2018-08-11 DIAGNOSIS — M6281 Muscle weakness (generalized): Secondary | ICD-10-CM

## 2018-08-11 DIAGNOSIS — R262 Difficulty in walking, not elsewhere classified: Secondary | ICD-10-CM | POA: Insufficient documentation

## 2018-08-11 DIAGNOSIS — R2681 Unsteadiness on feet: Secondary | ICD-10-CM | POA: Insufficient documentation
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
## 2018-08-14 ENCOUNTER — Encounter: Payer: Self-pay | Admitting: Gynecology

## 2018-08-14 ENCOUNTER — Ambulatory Visit: Payer: Medicare Other | Admitting: Gynecology

## 2018-08-14 VITALS — BP 120/78 | Ht 65.0 in | Wt 149.0 lb

## 2018-08-14 DIAGNOSIS — Z853 Personal history of malignant neoplasm of breast: Secondary | ICD-10-CM | POA: Diagnosis not present

## 2018-08-14 DIAGNOSIS — Z01419 Encounter for gynecological examination (general) (routine) without abnormal findings: Secondary | ICD-10-CM

## 2018-08-14 DIAGNOSIS — N952 Postmenopausal atrophic vaginitis: Secondary | ICD-10-CM

## 2018-08-14 DIAGNOSIS — M810 Age-related osteoporosis without current pathological fracture: Secondary | ICD-10-CM

## 2018-08-14 NOTE — Progress Notes (Signed)
    Gwendolyn Rowe [birth year] [id number]        84 y.o.  G2P2002 for annual gynecologic exam.  Without gynecologic complaints  Past medical history,surgical history, problem list, medications, allergies, family history and social history were all reviewed and documented as reviewed in the EPIC chart.  ROS:  Performed with pertinent positives and negatives included in the history, assessment and plan.   Additional significant findings : None   Exam: [name] assistant Vitals:   08/14/18 1051  BP: 120/78  Weight: 149 lb (67.6 kg)  Height: 5\' 5"  (1.651 m)   Body mass index is 24.79 kg/m.  General appearance:  Normal affect, orientation and appearance. Skin: Grossly normal HEENT: Without gross lesions.  No cervical or supraclavicular adenopathy. Thyroid normal.  Lungs:  Clear without wheezing, rales or rhonchi Cardiac: RR, without RMG Abdominal:  Soft, nontender, without masses, guarding, rebound, organomegaly or hernia Breasts:  Examined lying and sitting.  Left without masses, retractions, discharge or axillary adenopathy.  Right status post mastectomy.  No masses or adenopathy. Pelvic:  Ext, BUS, Vagina: With atrophic changes  Cervix: With atrophic changes  Uterus: Anteverted, normal size, shape and contour, midline and mobile nontender   Adnexa: Without masses or tenderness    Anus and perineum: Normal   Rectovaginal: Normal sphincter tone without palpated masses or tenderness.    Assessment/Plan:  84 y.o. G28P2002 female for annual gynecologic exam.   1. Postmenopausal/atrophic genital changes.  No significant menopausal symptoms or any vaginal bleeding. 2. History of right breast cancer.  Exam NED.  Mammography coming due. 3. Pap smear 2009.  No Pap smear done today.  No history of abnormal Pap smears.  We both agree to stop screening per current screening guidelines based on age. 4. Osteoporosis.  Actively being followed by Dr. Shelia Media.  On Prolia.  Continue to follow-up  with him in reference to bone health. 5. Colonoscopy training.  Being monitored by Dr. Shelia Media.  We will continue to follow-up with him in reference to this. 6. Health maintenance.  No routine lab work done as patient does this elsewhere.  Follow-up 1 year, sooner as needed.   Anastasio Auerbach MD, 11:14 AM 08/14/2018

## 2018-08-14 NOTE — Patient Instructions (Signed)
Follow-up in 1 year for annual exam 

## 2018-08-18 ENCOUNTER — Telehealth: Payer: Self-pay | Admitting: Physical Therapy

## 2018-08-18 ENCOUNTER — Ambulatory Visit: Payer: Medicare Other | Admitting: Physical Therapy

## 2018-08-18 ENCOUNTER — Encounter: Payer: Self-pay | Admitting: Physical Therapy

## 2018-08-18 DIAGNOSIS — R262 Difficulty in walking, not elsewhere classified: Secondary | ICD-10-CM

## 2018-08-18 DIAGNOSIS — M6281 Muscle weakness (generalized): Secondary | ICD-10-CM

## 2018-08-18 DIAGNOSIS — R2681 Unsteadiness on feet: Secondary | ICD-10-CM

## 2018-08-18 NOTE — Telephone Encounter (Signed)
Order was placed, will be faxed, note was modified.
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
## 2018-08-18 NOTE — Telephone Encounter (Signed)
Dr. Krista Blue,  Thank you for referring Gwendolyn Rowe for OPPT due to gait abnormality. She was evaluated by PT on 07/07/18.  The patient would benefit from a built up shoe for her right lower extremity due to shortening compared to left leg (more than 1/2 inch difference). She was initially resistant to this idea. However after several weeks of therapy focusing on stretching and strengthening her hip muscles with little to no improvement in her left hip/thigh pain, she would like to obtain a built up shoe. A built up shoe will improve her safety with ambulation and could potentially help with her hip/thigh pain,  For insurance to cover a built up shoe, she will need a prescription/order from her physician, "Orthotist consult for right built up shoe due to RLE more than 1/2 inch shorter than LLE." Insurance will also require documentation of need for the built up shoe in your progress note. You can either addend your note of 05/14/18 to include the information re: leg length discrepancy, or if you prefer you could have [name] come in for another appointment for you to further assess her.    If you agree, please place an order in [facility] in [facility] or fax the order to [phone]. I can then provide her with the prescription and information on the local orthotists she can choose from.  Thank you,   Barry Brunner, PT Outpatient [facility]habilitation [address] [place], [place] [address] [phone]

## 2018-08-26 ENCOUNTER — Ambulatory Visit: Payer: Medicare Other | Admitting: Physical Therapy

## 2018-08-26 ENCOUNTER — Encounter: Payer: Self-pay | Admitting: Physical Therapy

## 2018-08-26 DIAGNOSIS — R2681 Unsteadiness on feet: Secondary | ICD-10-CM

## 2018-08-26 DIAGNOSIS — M6281 Muscle weakness (generalized): Secondary | ICD-10-CM

## 2018-08-26 DIAGNOSIS — R262 Difficulty in walking, not elsewhere classified: Secondary | ICD-10-CM | POA: Diagnosis not present

## 2018-08-26 NOTE — Patient Instructions (Signed)
Access Code: [email or web address]  URL: [email or web address]  Date: 08/26/2018  Prepared by: [name]   Exercises  Sit to Stand with Resistance Around Legs - 10 reps - 1 sets - 1x daily - 5x weekly  Romberg Stance Eyes Closed on Foam Pad - 3 reps - 1 sets - 30 hold - 1x daily - 5x weekly  Wide Stance with Eyes Closed and Head Rotation on Foam Pad - 10 reps - 1 sets - 1x daily - 5x weekly  Wide Stance with Eyes Closed and Head Nods on Foam Pad - 10 reps - 1 sets - 1x daily - 5x weekly  Half Tandem Stance with Eyes Closed on Foam Pad - 3 reps - 1 sets - 30 hold - 1x daily - 5x weekly  Side Stepping with Resistance at Thighs - 10 reps - 3 sets - 1x daily - 5x weekly  Forward Monster Walks - 10 reps - 3 sets - 1x daily - 5x weekly  Backward Monster Walks - 10 reps - 3 sets - 1x daily - 5x weekly   Added this to HEP today: Standing Quarter Turn - 10 reps - 1 sets - 1x daily - 5x weekly

## 2018-08-27 ENCOUNTER — Ambulatory Visit
Admission: RE | Admit: 2018-08-27 | Discharge: 2018-08-27 | Disposition: A | Discharge status: Home / Self Care | Payer: Medicare Other | Source: Ambulatory Visit | Attending: Internal Medicine | Admitting: Internal Medicine

## 2018-08-27 DIAGNOSIS — Z1231 Encounter for screening mammogram for malignant neoplasm of breast: Secondary | ICD-10-CM
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
## 2018-09-03 ENCOUNTER — Ambulatory Visit: Payer: Medicare Other | Admitting: Physical Therapy

## 2018-09-08 ENCOUNTER — Ambulatory Visit: Payer: Medicare Other | Attending: Neurology | Admitting: Physical Therapy

## 2018-09-08 ENCOUNTER — Encounter: Payer: Self-pay | Admitting: Physical Therapy

## 2018-09-08 DIAGNOSIS — R262 Difficulty in walking, not elsewhere classified: Secondary | ICD-10-CM | POA: Diagnosis present

## 2018-09-08 DIAGNOSIS — R2681 Unsteadiness on feet: Secondary | ICD-10-CM

## 2018-09-08 DIAGNOSIS — M6281 Muscle weakness (generalized): Secondary | ICD-10-CM | POA: Diagnosis present
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
## 2018-09-08 NOTE — Patient Instructions (Signed)
Access Code: [email or web address]  URL: [email or web address]  Date: 09/08/2018  Prepared by: Barry Brunner   Exercises  Sit to Stand with Resistance Around Legs - 10 reps - 1 sets - 1x daily - 5x weekly  Romberg Stance Eyes Closed on Foam Pad - 3 reps - 1 sets - 30 hold - 1x daily - 5x weekly  Wide Stance with Eyes Closed and Head Rotation on Foam Pad - 10 reps - 1 sets - 1x daily - 5x weekly  Wide Stance with Eyes Closed and Head Nods on Foam Pad - 10 reps - 1 sets - 1x daily - 5x weekly  Half Tandem Stance with Eyes Closed on Foam Pad - 3 reps - 1 sets - 30 hold - 1x daily - 5x weekly  Side Stepping with Resistance at Thighs - 10 reps - 3 sets - 1x daily - 5x weekly  Forward Monster Walks - 10 reps - 3 sets - 1x daily - 5x weekly  Backward Monster Walks - 10 reps - 3 sets - 1x daily - 5x weekly  Standing Quarter Turn - 10 reps - 1 sets - 1x daily - 5x weekly   ADDED Standing Single Leg Stance with Unilateral Counter Support - 5 reps - 1 sets - 10 hold - 1x daily - 5x weekly

## 2018-09-15 ENCOUNTER — Encounter: Payer: Self-pay | Admitting: Physical Therapy

## 2018-09-15 ENCOUNTER — Ambulatory Visit: Payer: Medicare Other | Admitting: Physical Therapy

## 2018-09-15 DIAGNOSIS — R262 Difficulty in walking, not elsewhere classified: Secondary | ICD-10-CM | POA: Diagnosis not present

## 2018-09-15 DIAGNOSIS — M6281 Muscle weakness (generalized): Secondary | ICD-10-CM

## 2018-09-15 DIAGNOSIS — R2681 Unsteadiness on feet: Secondary | ICD-10-CM
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
## 2018-09-15 NOTE — Patient Instructions (Addendum)
   Access Code: [email or web address]  URL: [email or web address]  Date: 09/15/2018  Prepared by: Barry Brunner   Exercises  Sit to Stand with Resistance Around Legs - 10 reps - 1 sets - 1x daily - 5x weekly  Romberg Stance Eyes Closed on Foam Pad - 3 reps - 1 sets - 30 hold - 1x daily - 5x weekly  Wide Stance with Eyes Closed and Head Rotation on Foam Pad - 10 reps - 1 sets - 1x daily - 5x weekly  Wide Stance with Eyes Closed and Head Nods on Foam Pad - 10 reps - 1 sets - 1x daily - 5x weekly  Half Tandem Stance with Eyes Closed on Foam Pad - 3 reps - 1 sets - 30 hold - 1x daily - 5x weekly  Side Stepping with Resistance at Thighs - 10 reps - 3 sets - 1x daily - 5x weekly  Forward Monster Walks - 10 reps - 3 sets - 1x daily - 5x weekly  Backward Monster Walks - 10 reps - 3 sets - 1x daily - 5x weekly  Standing Quarter Turn - 10 reps - 1 sets - 1x daily - 5x weekly  Standing Single Leg Stance with Unilateral Counter Support - 5 reps - 1 sets - 10 hold - 1x daily - 5x weekly  single leg taps - 10 reps - 1 sets - - hold - 1x daily - 5x weekly

## 2018-09-23 ENCOUNTER — Encounter: Payer: Self-pay | Admitting: Physical Therapy

## 2018-09-23 ENCOUNTER — Ambulatory Visit: Payer: Medicare Other | Admitting: Physical Therapy

## 2018-09-23 DIAGNOSIS — R262 Difficulty in walking, not elsewhere classified: Secondary | ICD-10-CM | POA: Diagnosis not present

## 2018-09-23 DIAGNOSIS — M6281 Muscle weakness (generalized): Secondary | ICD-10-CM
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
## 2018-10-07 ENCOUNTER — Ambulatory Visit: Payer: Medicare Other | Admitting: Physical Therapy

## 2018-10-09 ENCOUNTER — Encounter: Payer: Self-pay | Admitting: Physical Therapy

## 2018-10-09 ENCOUNTER — Ambulatory Visit: Payer: Medicare Other | Attending: Neurology | Admitting: Physical Therapy

## 2018-10-09 DIAGNOSIS — R262 Difficulty in walking, not elsewhere classified: Secondary | ICD-10-CM | POA: Insufficient documentation

## 2018-10-09 DIAGNOSIS — M6281 Muscle weakness (generalized): Secondary | ICD-10-CM | POA: Insufficient documentation

## 2018-10-09 DIAGNOSIS — R2681 Unsteadiness on feet: Secondary | ICD-10-CM | POA: Diagnosis present
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
## 2018-10-09 NOTE — Patient Instructions (Signed)
Access Code: [email or web address]  URL: [email or web address]  Date: 10/09/2018  Prepared by: Barry Brunner   Exercises  Sit to Stand with Resistance Around Legs - 10 reps - 1 sets - 1x daily - 5x weekly  Romberg Stance Eyes Closed on Foam Pad - 3 reps - 1 sets - 30 hold - 1x daily - 5x weekly  Wide Stance with Eyes Closed and Head Rotation on Foam Pad - 10 reps - 1 sets - 1x daily - 5x weekly  Wide Stance with Eyes Closed and Head Nods on Foam Pad - 10 reps - 1 sets - 1x daily - 5x weekly  Half Tandem Stance with Eyes Closed on Foam Pad - 3 reps - 1 sets - 30 hold - 1x daily - 5x weekly  Side Stepping with Resistance at Thighs - 10 reps - 3 sets - 1x daily - 5x weekly  Forward Monster Walks - 10 reps - 3 sets - 1x daily - 5x weekly  Backward Monster Walks - 10 reps - 3 sets - 1x daily - 5x weekly  Standing Quarter Turn - 10 reps - 1 sets - 1x daily - 5x weekly  Standing Single Leg Stance with Unilateral Counter Support - 5 reps - 1 sets - 10 hold - 1x daily - 5x weekly  single leg taps - 10 reps - 1 sets - - hold - 1x daily - 5x weekly   ADDED TODAY Clamshell with Resistance - 10 reps - 2-3 sets - 0-3 sec hold - 1x daily - 3-4x weekly

## 2018-10-13 ENCOUNTER — Ambulatory Visit: Payer: Medicare Other | Admitting: Physical Therapy

## 2018-10-20 ENCOUNTER — Encounter: Payer: Self-pay | Admitting: Physical Therapy

## 2018-10-20 ENCOUNTER — Ambulatory Visit: Payer: Medicare Other | Admitting: Physical Therapy

## 2018-10-20 DIAGNOSIS — R2681 Unsteadiness on feet: Secondary | ICD-10-CM

## 2018-10-20 DIAGNOSIS — M6281 Muscle weakness (generalized): Secondary | ICD-10-CM

## 2018-10-20 DIAGNOSIS — R262 Difficulty in walking, not elsewhere classified: Secondary | ICD-10-CM | POA: Diagnosis not present

## 2018-10-20 NOTE — Patient Instructions (Signed)
Access Code: [email or web address]  URL: [email or web address]  Date: 10/20/2018  Prepared by: Barry Brunner   Exercises Added to HEP Seated Piriformis Stretch - 3 reps - 1 sets - 30 hold - 2x daily - 7x weekly  Supine Piriformis Stretch with Foot on Ground - 3 reps - 1 sets - 30 hold - 2x daily - 7x weekly  Hip Flexion Stretch - 3 reps - 1 sets - 30 hold - 2x daily - 7x weekly
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
## 2018-10-21 ENCOUNTER — Telehealth: Payer: Self-pay | Admitting: Neurology

## 2018-10-21 NOTE — Telephone Encounter (Signed)
Appointment Request From: [name]    With Provider: Marcial Pacas, MD [[facility]]    Preferred Date Range: Any date 10/21/2018 or later    Preferred Times: Any time    Reason for visit: Request an Appointment    Comments:  I am in a lot of pain in the left side of my lower back, buttock, and front of left leg. I kept an appointment yesterday with Mickel Baas[facility]. She and I evaluated my symptoms, and concluded the likely source of pain is sciatic. Prescribed exercise helped but did not alleviate the pain. The pain prevents me from lying down at all at night. I need medication for the pain; can you prescribe a pain medication through [facility], please. Thank you

## 2018-10-21 NOTE — Telephone Encounter (Signed)
Spoke with Gwendolyn Rowe. She sts. she is currently taking Gabapentin 300mg  qhs. No daytime doses. I have explained that increasing Gabapentin to the rx'd 300mg  tid is likely to help sciatica pain more. She sts. that when she initially started Gabapentin, she  had some fogginess with it. I have explained we can titrate her Gabapentin up to a therapeutic dose, to increase her chances of tolerating it. She is already taking 300mg  qhs. She can add 100mg  qam and 100mg  at lunch for several days, increase by 1 capsule in the am and at lunch, as tolerated, until she has reached a therapeutic dose. If she experiences too much drowsiness with any of the doses, she will call to let us know. She sts. her pharmacist recommended high dose of Ibuprofen to treat the pain as well. I have explained that both Gabapentin and Ibuprofen can affect the kidneys, so we do not recommend continuing Ibuprofen on a regular basis--but can take occasionally for added pain relief. She does not have a hx. of renal disease. She verbalized understanding of same, will call back if she has problems with increasing Gabapentin/fim

## 2018-10-21 NOTE — Telephone Encounter (Signed)
We need to call patient and verify how much gabapentin she is taking.  Her last office notes indicates she is only taking 200mg  at bedtime.  Her prescription is for gabapentin 100mg , 3 capsules TID.    Per vo by Dr. Krista Blue, if she is taking low dose gabapentin then she can simply increase up to the prescribed amount.  If she is already taking the higher dose and it is not helpful, then she will offer Lyrica or Cymbalta.

## 2018-10-27 ENCOUNTER — Ambulatory Visit: Payer: Medicare Other | Admitting: Physical Therapy

## 2018-11-03 ENCOUNTER — Encounter: Payer: Self-pay | Admitting: Physical Therapy

## 2018-11-03 ENCOUNTER — Ambulatory Visit: Payer: Medicare Other | Admitting: Physical Therapy

## 2018-11-03 DIAGNOSIS — R262 Difficulty in walking, not elsewhere classified: Secondary | ICD-10-CM | POA: Diagnosis not present

## 2018-11-03 DIAGNOSIS — M6281 Muscle weakness (generalized): Secondary | ICD-10-CM

## 2018-11-03 NOTE — Patient Instructions (Signed)
   Begin wearing your built-up shoe for an hour at a time each day. If you have no symptoms, increase wearing by 15 minutes each day. If you have symptoms, stay at that same amount of time for the next day until symptom free.  When you get up to 2 hours at a time, begin wearing shoe a second time during the day (may be able to do 2 hours then too).    Try stretches before you go to sleep.   Other PT exercises, at least some every day (if can't get through them alll, divide them into 2 groups and alternate days).
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
## 2018-11-14 ENCOUNTER — Ambulatory Visit: Payer: Medicare Other | Attending: Neurology | Admitting: Physical Therapy

## 2018-11-14 ENCOUNTER — Encounter: Payer: Self-pay | Admitting: Physical Therapy

## 2018-11-14 DIAGNOSIS — M6281 Muscle weakness (generalized): Secondary | ICD-10-CM | POA: Insufficient documentation

## 2018-11-14 DIAGNOSIS — R262 Difficulty in walking, not elsewhere classified: Secondary | ICD-10-CM | POA: Insufficient documentation

## 2018-11-14 DIAGNOSIS — R2681 Unsteadiness on feet: Secondary | ICD-10-CM
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
## 2018-11-17 ENCOUNTER — Ambulatory Visit: Payer: Medicare Other | Admitting: Physical Therapy

## 2018-11-20 ENCOUNTER — Encounter: Payer: Self-pay | Admitting: Physical Therapy

## 2018-11-20 ENCOUNTER — Ambulatory Visit: Payer: Medicare Other | Admitting: Physical Therapy

## 2018-11-20 DIAGNOSIS — R262 Difficulty in walking, not elsewhere classified: Secondary | ICD-10-CM

## 2018-11-20 DIAGNOSIS — M6281 Muscle weakness (generalized): Secondary | ICD-10-CM

## 2018-11-20 DIAGNOSIS — R2681 Unsteadiness on feet: Secondary | ICD-10-CM | POA: Diagnosis present
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
## 2019-01-21 ENCOUNTER — Other Ambulatory Visit: Payer: Self-pay | Admitting: Neurology

## 2019-01-26 ENCOUNTER — Encounter: Payer: Self-pay | Admitting: Neurology

## 2019-01-26 ENCOUNTER — Other Ambulatory Visit: Payer: Self-pay | Admitting: Endocrinology

## 2019-01-26 ENCOUNTER — Ambulatory Visit (INDEPENDENT_AMBULATORY_CARE_PROVIDER_SITE_OTHER): Payer: Medicare Other | Admitting: Neurology

## 2019-01-26 DIAGNOSIS — R202 Paresthesia of skin: Secondary | ICD-10-CM

## 2019-01-26 DIAGNOSIS — E049 Nontoxic goiter, unspecified: Secondary | ICD-10-CM
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
## 2019-02-03 ENCOUNTER — Telehealth: Payer: Self-pay | Admitting: Neurology

## 2019-02-03 NOTE — Telephone Encounter (Signed)
LVM to schedule NCV/EMG per Dr. Krista Blue.

## 2019-02-23 ENCOUNTER — Other Ambulatory Visit: Payer: Self-pay | Admitting: Neurology

## 2019-03-13 ENCOUNTER — Encounter: Payer: Medicare Other | Admitting: Neurology

## 2019-03-16 ENCOUNTER — Ambulatory Visit
Admission: RE | Admit: 2019-03-16 | Discharge: 2019-03-16 | Disposition: A | Discharge status: Home / Self Care | Payer: Medicare Other | Source: Ambulatory Visit | Attending: Endocrinology | Admitting: Endocrinology

## 2019-03-16 ENCOUNTER — Other Ambulatory Visit: Payer: Self-pay

## 2019-03-16 DIAGNOSIS — E049 Nontoxic goiter, unspecified: Secondary | ICD-10-CM

## 2019-03-16 IMAGING — MG DIGITAL SCREENING UNILATERAL LEFT MAMMOGRAM WITH CAD AND TOMO
4 series · 4 of 12 positions shown · non-contrast
Comparison: Previous exam(s).

CLINICAL DATA: Screening.

EXAM:
DIGITAL SCREENING UNILATERAL LEFT MAMMOGRAM WITH CAD AND TOMO

[L MLO synth-2D]
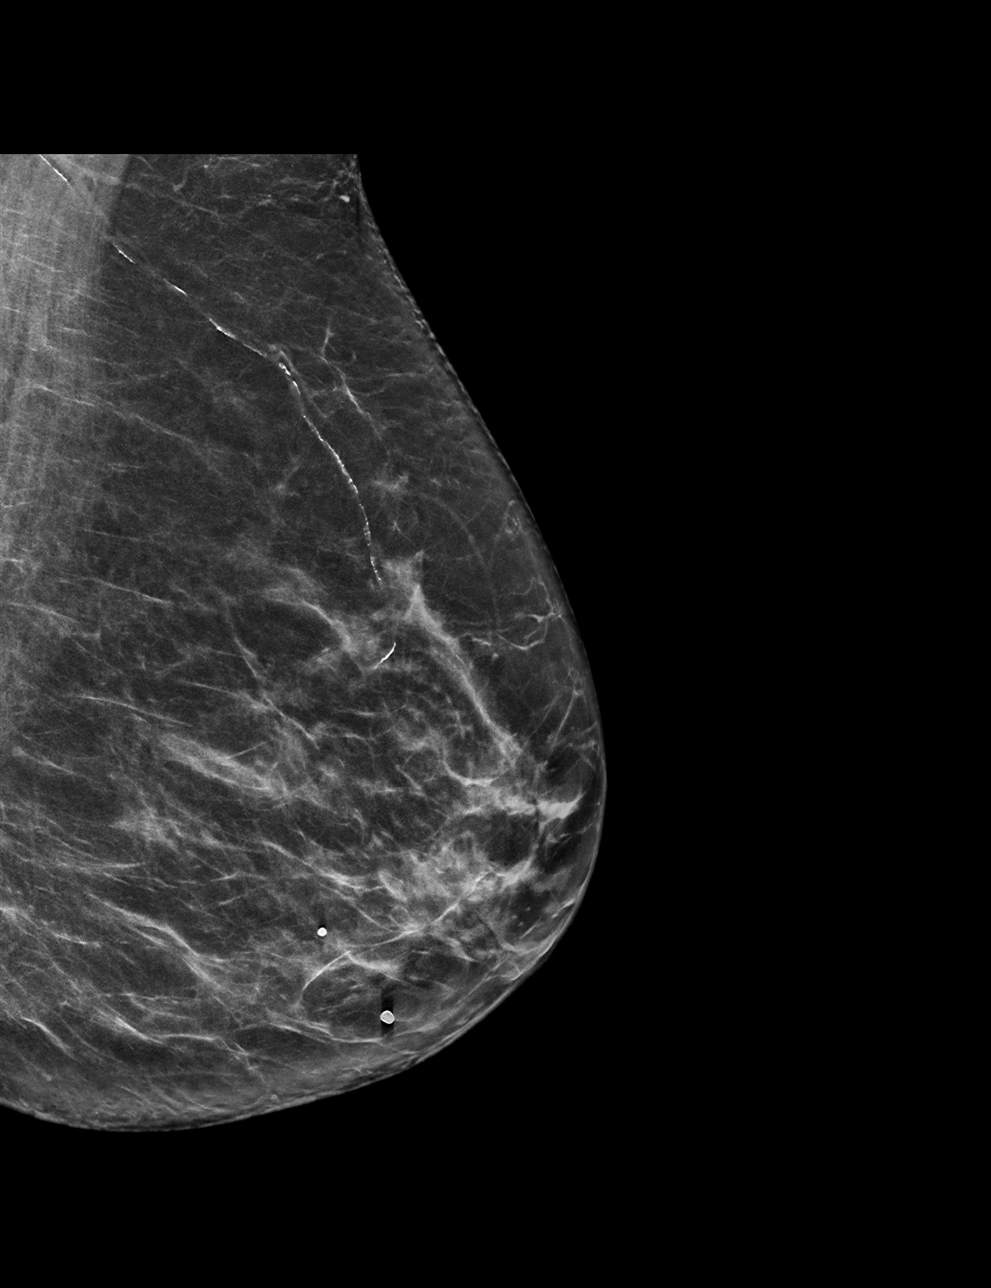

[L CC synth-2D]
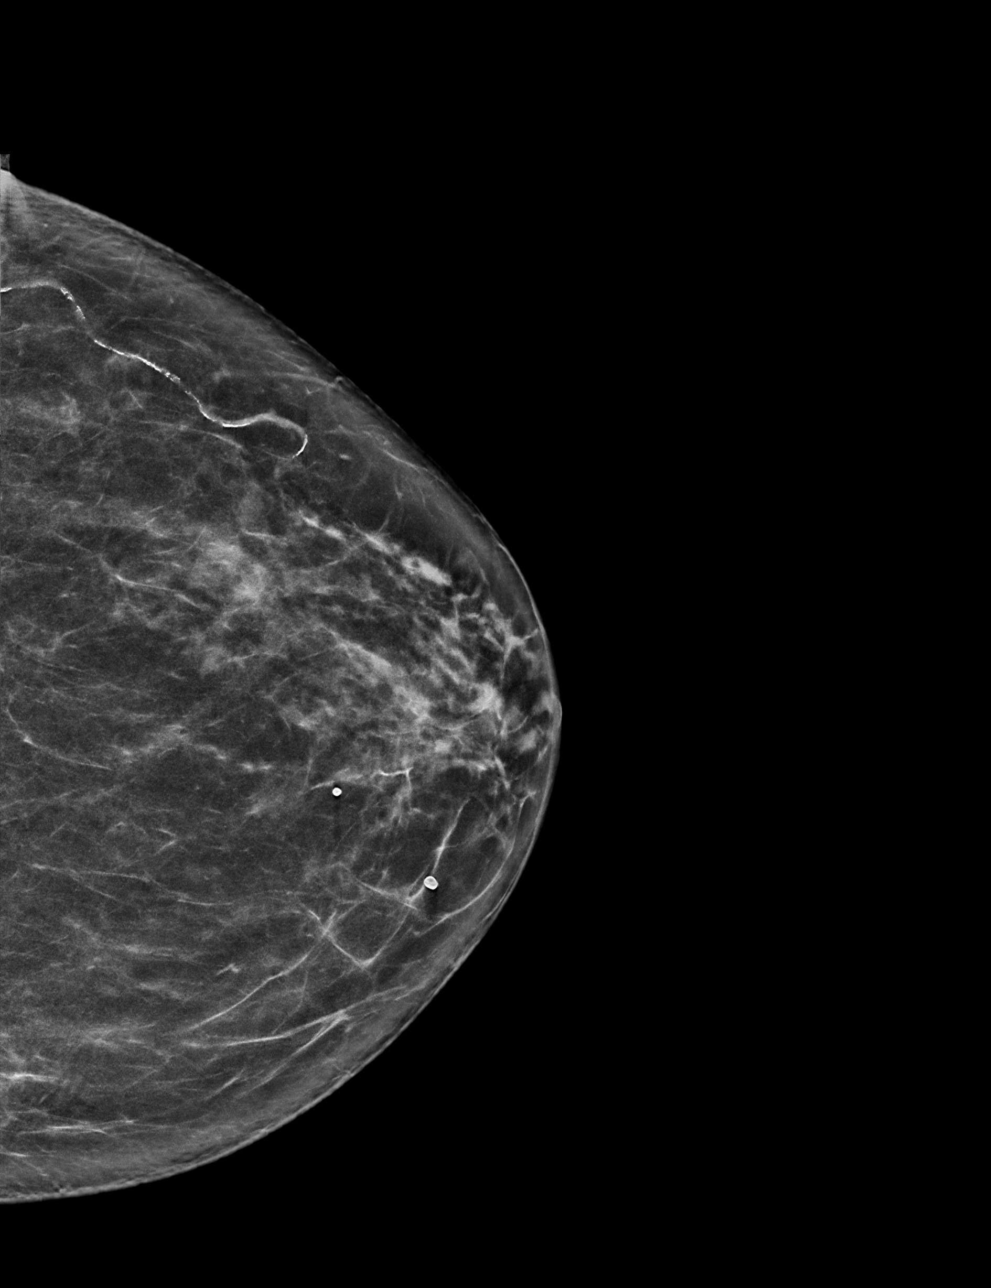

[L MLO tomo · tomo slice 35/69.0]
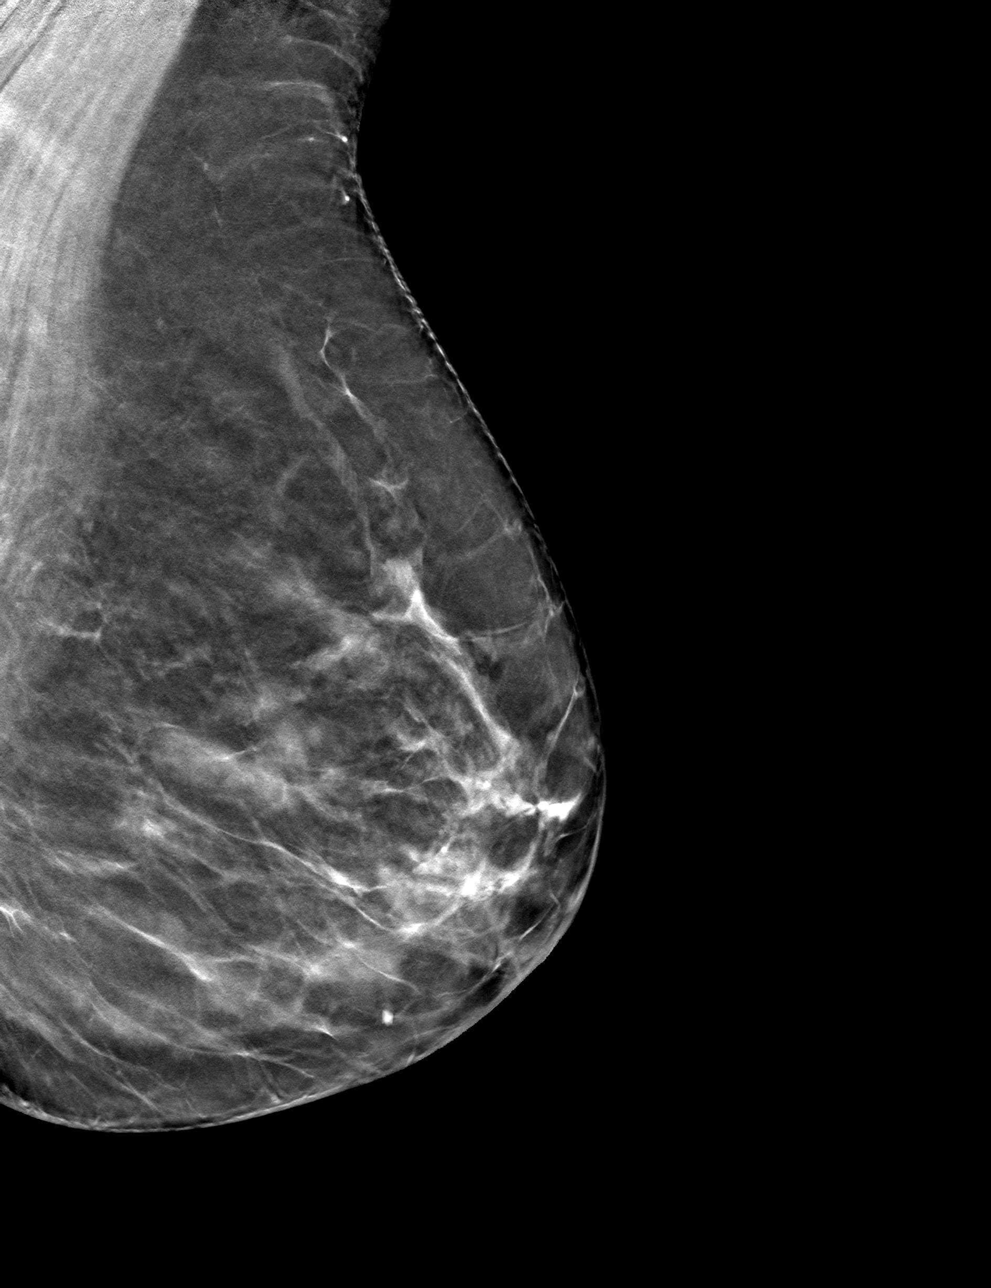

[L CC tomo · tomo slice 33/64.0]
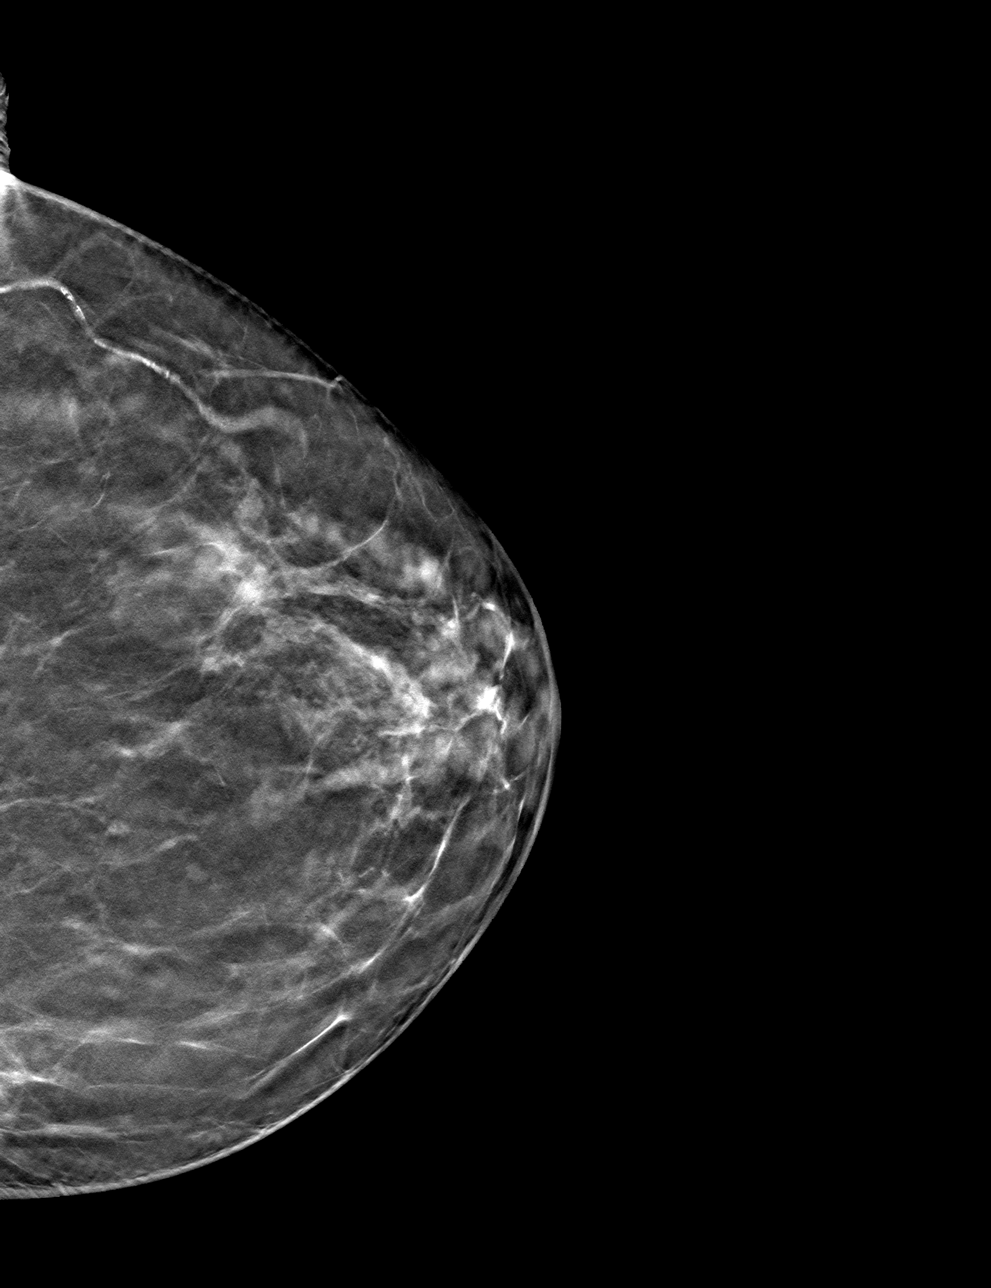

[4 of 12 positions shown; findings below may reference images not displayed]

ACR Breast Density Category b: There are scattered areas of
fibroglandular density.
FINDINGS: The patient has had a right mastectomy. There are no findings
suspicious for malignancy.

Images were processed with CAD.
IMPRESSION: No mammographic evidence of malignancy. A result letter of this
screening mammogram will be mailed directly to the patient.

RECOMMENDATION:
Screening mammogram in one year.  (Code:GY-Q-70Y)

BI-RADS CATEGORY  1: Negative.

## 2019-04-01 ENCOUNTER — Ambulatory Visit (INDEPENDENT_AMBULATORY_CARE_PROVIDER_SITE_OTHER): Payer: Medicare Other | Admitting: Neurology

## 2019-04-01 ENCOUNTER — Other Ambulatory Visit: Payer: Self-pay

## 2019-04-01 DIAGNOSIS — G5601 Carpal tunnel syndrome, right upper limb: Secondary | ICD-10-CM

## 2019-04-01 DIAGNOSIS — Z0289 Encounter for other administrative examinations: Secondary | ICD-10-CM

## 2019-04-01 DIAGNOSIS — R202 Paresthesia of skin: Secondary | ICD-10-CM

## 2019-04-01 NOTE — Procedures (Signed)
        Full Name: Rachal Dvorsky Gender: Female MRN #: [id number] Date of Birth: [birth year]    Visit Date: 04/01/2019 10:17 Age: 85 Years 34 Months Old Examining Physician: Marcial Pacas, MD  Referring Physician: Marcial Pacas, MD History: 85 year old right-handed female, presented with few months history of right hand paresthesia  Summary of the tests: Nerve conduction study: Right median sensory response showed moderately prolonged peak latency, with severely decreased snap amplitude.  Right median motor response showed moderately prolonged distal latency, with mildly decreased the C map amplitude, mildly slow conduction velocity.  Right ulnar sensory and motor responses were normal.  Electromyography:  Needle examinations was performed at right upper extremity muscles.  There is normal insertional activity, normal motor unit potential, with mildly decreased recruitment patterns.  Conclusion: This is an abnormal study. There is electrodiagnostic evidence of right median neuropathy across the wrist, distant with moderate right carpal tunnel syndrome.    ------------------------------- Marcial Pacas, M.D. Ph.D.  [facility] [place], [place] [address] Tel: [phone] Fax: [phone]        [facility]    Nerve / Sites Muscle Latency Ref. Amplitude Ref. Rel Amp Segments Distance Velocity Ref. Area    ms ms mV mV %  cm m/s m/s mVms  R Median - APB     Wrist APB 7.4 ?4.4 3.7 ?4.0 100 Wrist - APB 7   17.9     Upper arm APB 12.7  3.6  96.2 Upper arm - Wrist 22 42 ?49 16.9  R Ulnar - ADM     Wrist ADM 2.3 ?3.3 7.9 ?6.0 100 Wrist - ADM 7   19.5     B.Elbow ADM 6.2  7.1  90 B.Elbow - Wrist 19 49 ?49 17.3     A.Elbow ADM 8.5  6.6  92.6 A.Elbow - B.Elbow 10 44 ?49 17.1         A.Elbow - Wrist             SNC    Nerve / Sites Rec. Site Peak Lat Ref.  Amp Ref. Segments Distance    ms ms V V  cm  R Median - Orthodromic (Dig II, Mid palm)     Dig II Wrist 5.4  ?3.4 2 ?10 Dig II - Wrist 13  R Ulnar - Orthodromic, (Dig V, Mid palm)     Dig V Wrist 3.1 ?3.1 7 ?5 Dig V - Wrist 64         F  Wave    Nerve F Lat Ref.   ms ms  R Ulnar - ADM 30.1 ?32.0       EMG       EMG Summary Table    Spontaneous MUAP Recruitment  Muscle IA Fib PSW Fasc Other Amp Dur. Poly Pattern  R. Abductor pollicis brevis Normal None None None _______ Increased Normal Normal Reduced  R. First dorsal interosseous Normal None None None _______ Normal Normal Normal Normal  R. Pronator teres Normal None None None _______ Normal Normal Normal Normal  R. Biceps brachii Normal None None None _______ Normal Normal Normal Normal  R. Deltoid Normal None None None _______ Normal Normal Normal Normal

## 2019-04-24 ENCOUNTER — Other Ambulatory Visit: Payer: Self-pay | Admitting: Neurology

## 2019-07-16 ENCOUNTER — Encounter: Payer: Self-pay | Admitting: Gynecology

## 2019-08-14 ENCOUNTER — Other Ambulatory Visit: Payer: Self-pay | Admitting: Internal Medicine

## 2019-08-14 DIAGNOSIS — Z1231 Encounter for screening mammogram for malignant neoplasm of breast: Secondary | ICD-10-CM

## 2019-08-18 ENCOUNTER — Ambulatory Visit (INDEPENDENT_AMBULATORY_CARE_PROVIDER_SITE_OTHER): Payer: Medicare Other | Admitting: Gynecology

## 2019-08-18 ENCOUNTER — Encounter: Payer: Self-pay | Admitting: Gynecology

## 2019-08-18 ENCOUNTER — Other Ambulatory Visit: Payer: Self-pay

## 2019-08-18 VITALS — BP 124/78 | Ht 65.0 in | Wt 150.0 lb

## 2019-08-18 DIAGNOSIS — M810 Age-related osteoporosis without current pathological fracture: Secondary | ICD-10-CM

## 2019-08-18 DIAGNOSIS — N952 Postmenopausal atrophic vaginitis: Secondary | ICD-10-CM

## 2019-08-18 DIAGNOSIS — Z853 Personal history of malignant neoplasm of breast: Secondary | ICD-10-CM

## 2019-08-18 DIAGNOSIS — Z01419 Encounter for gynecological examination (general) (routine) without abnormal findings: Secondary | ICD-10-CM | POA: Diagnosis not present

## 2019-08-18 NOTE — Patient Instructions (Signed)
Follow-up as we discussed at your preference.

## 2019-08-18 NOTE — Progress Notes (Signed)
    Gwendolyn Rowe 07-25-1934 PO:[id number]        85 y.o.  G2P2002 for annual gynecologic exam.  Without gynecologic complaints  Past medical history,surgical history, problem list, medications, allergies, family history and social history were all reviewed and documented as reviewed in the EPIC chart.  ROS:  Performed with pertinent positives and negatives included in the history, assessment and plan.   Additional significant findings : None   Exam: [name] assistant Vitals:   08/18/19 1056  BP: 124/78  Weight: 150 lb (68 kg)  Height: 5\' 5"  (1.651 m)   Body mass index is 24.96 kg/m.  General appearance:  Normal affect, orientation and appearance. Skin: Grossly normal HEENT: Without gross lesions.  No cervical or supraclavicular adenopathy. Thyroid normal.  Lungs:  Clear without wheezing, rales or rhonchi Cardiac: RR, without RMG Abdominal:  Soft, nontender, without masses, guarding, rebound, organomegaly or hernia Breasts:  Examined lying and sitting.  Left without masses, retractions, discharge or axillary adenopathy.  Right status postmastectomy.  No masses or adenopathy Pelvic:  Ext, BUS, Vagina: With atrophic changes  Cervix: With atrophic changes  Uterus: Difficult to palpate but no gross masses or tenderness  Adnexa: Without masses or tenderness    Anus and perineum: Normal   Rectovaginal: Normal sphincter tone without palpated masses or tenderness.    Assessment/Plan:  85 y.o. G68P2002 female for annual gynecologic exam.   1. Postmenopausal.  No significant menopausal symptoms or any vaginal bleeding. 2. History of right breast cancer status postmastectomy.  Exam NED.  Mammography coming due and she will follow-up for this. 3. Osteoporosis.  Being followed by Dr. Shelia Media on [place].  We will continue to follow-up with him in reference to bone health. 4. Colonoscopy reported 2014.  Follow-up for colon screening per Dr. Pennie Banter recommendations. 5. Pap smear 2009.   No Pap smear done today.  No history of significant abnormal Pap smears.  We both agree to stop screening per current screening guidelines. 6. Health maintenance.  No routine lab work done as patient does this elsewhere.  We discussed whether to continue with annual GYN exams.  Issues of unlikely to find pathology in an asymptomatic patient at her age versus risks of missed pathology.  Patient will decide if she wants to continue with annual pelvic exams.  Regardless she knows she needs annual breast exam.   Belinda Block Nunzio Banet MD, 11:21 AM 08/18/2019

## 2019-10-07 ENCOUNTER — Ambulatory Visit
Admission: RE | Admit: 2019-10-07 | Discharge: 2019-10-07 | Disposition: A | Discharge status: Home / Self Care | Payer: Medicare Other | Source: Ambulatory Visit | Attending: Internal Medicine | Admitting: Internal Medicine

## 2019-10-07 ENCOUNTER — Other Ambulatory Visit: Payer: Self-pay

## 2019-10-07 DIAGNOSIS — Z1231 Encounter for screening mammogram for malignant neoplasm of breast: Secondary | ICD-10-CM

## 2019-10-17 IMAGING — US US THYROID
1 series · 14 of 25 positions shown · non-contrast
Comparison: 12/18/2017 and previous

CLINICAL DATA: Goiter. Previous FNA biopsy of inferior right nodule
12/28/2014

EXAM:
THYROID ULTRASOUND
TECHNIQUE: Ultrasound examination of the thyroid gland and adjacent soft
tissues was performed.

[Series 1: us thyroid · 0.06mm/px · 14 of 37 slices shown]
[im 1/37]
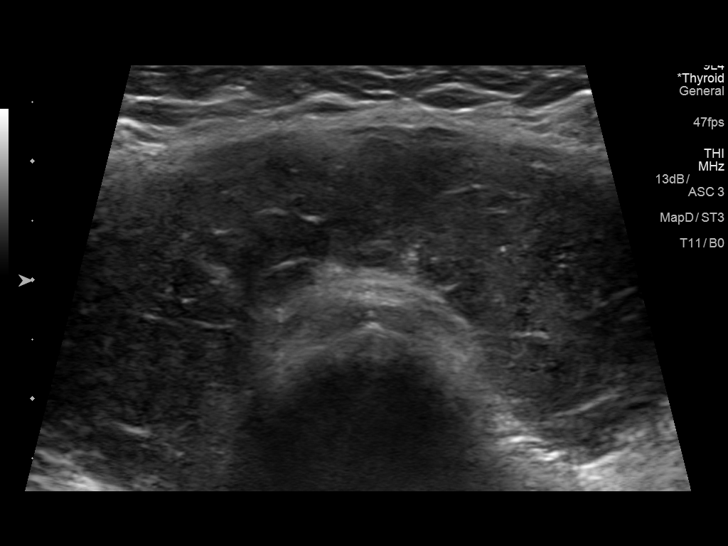
[im 4/37]
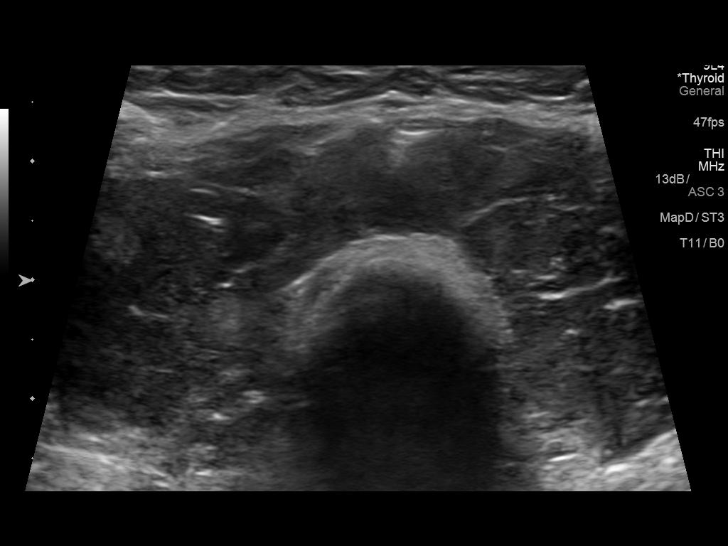
[im 7/37]
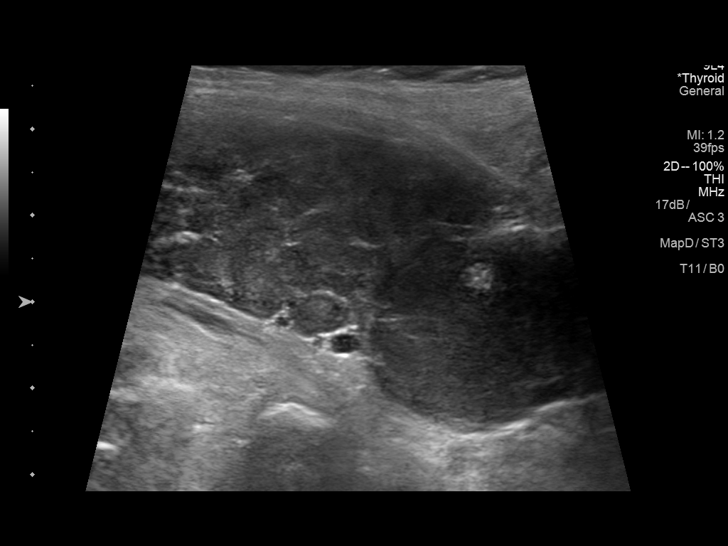
[im 10/37]
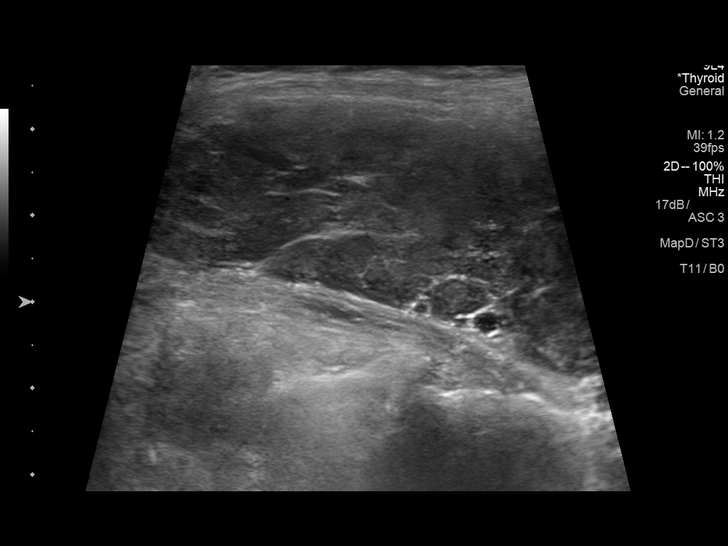
[im 13/37]
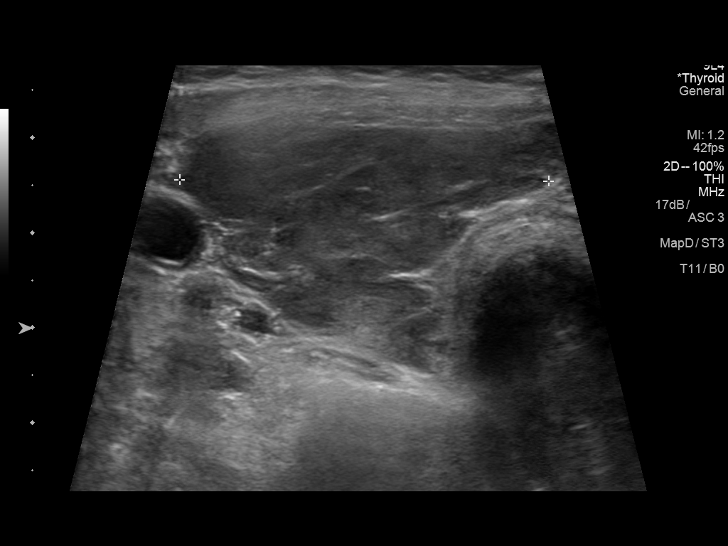
[im 14/37]
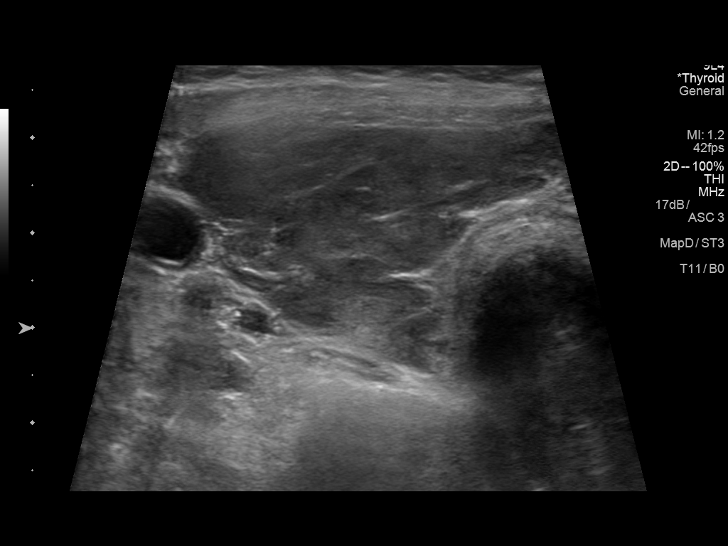
[im 17/37]
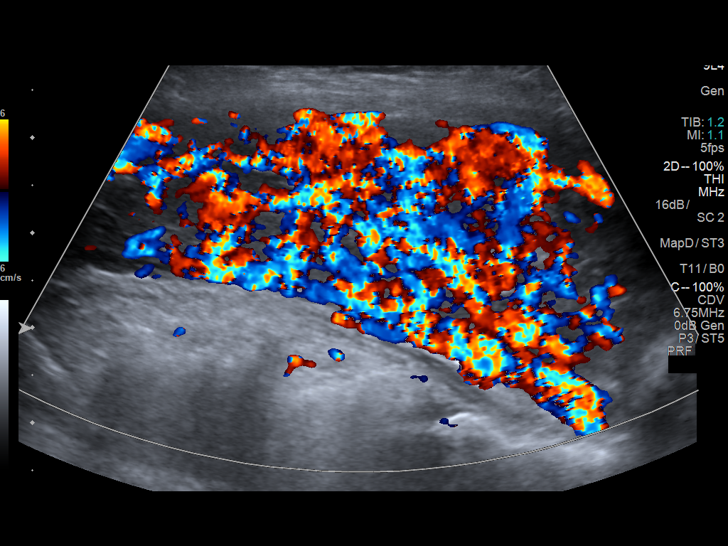
[im 20/37]
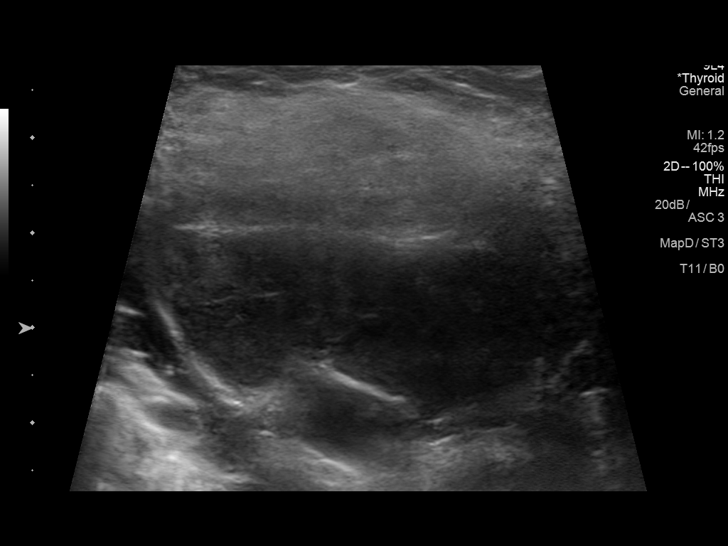
[im 23/37]
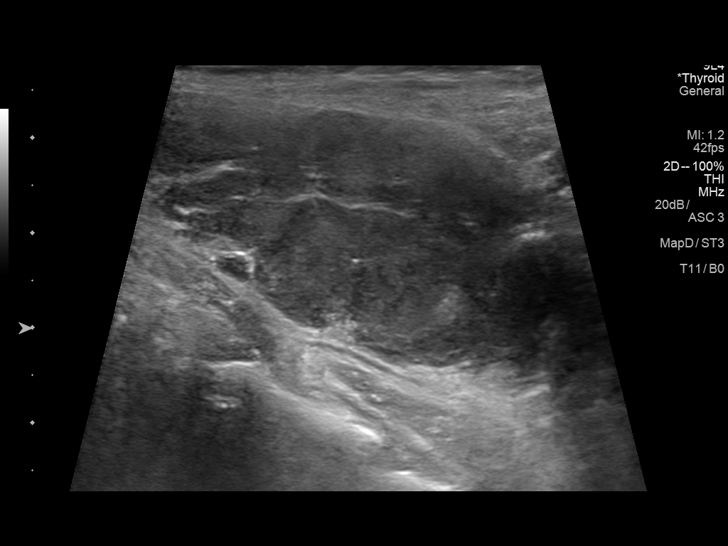
[im 25/37]
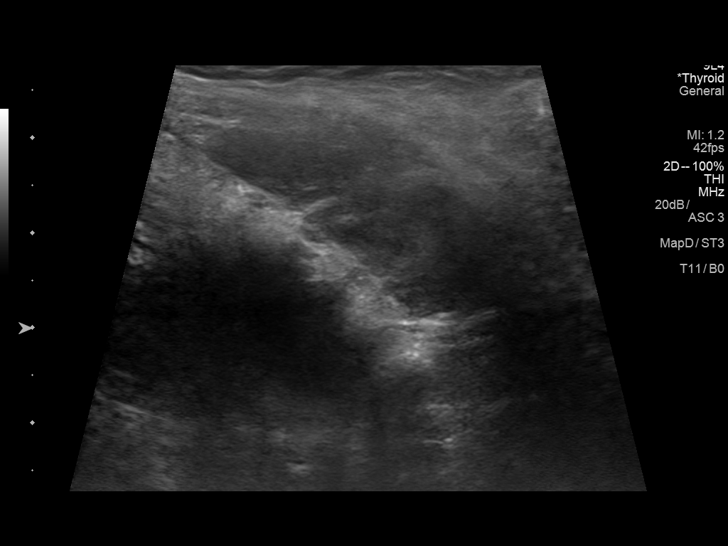
[im 28/37]
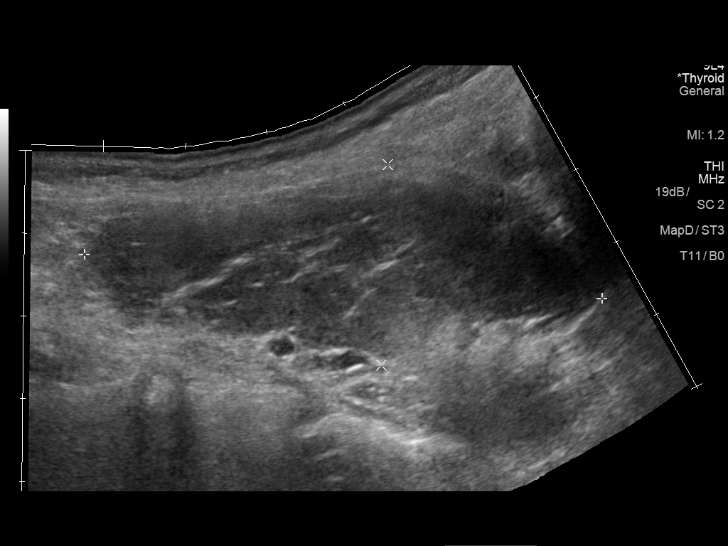
[im 31/37]
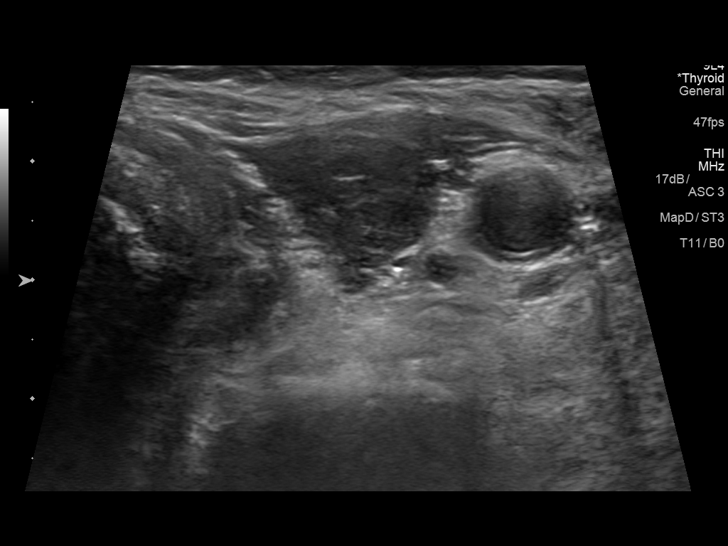
[im 34/37]
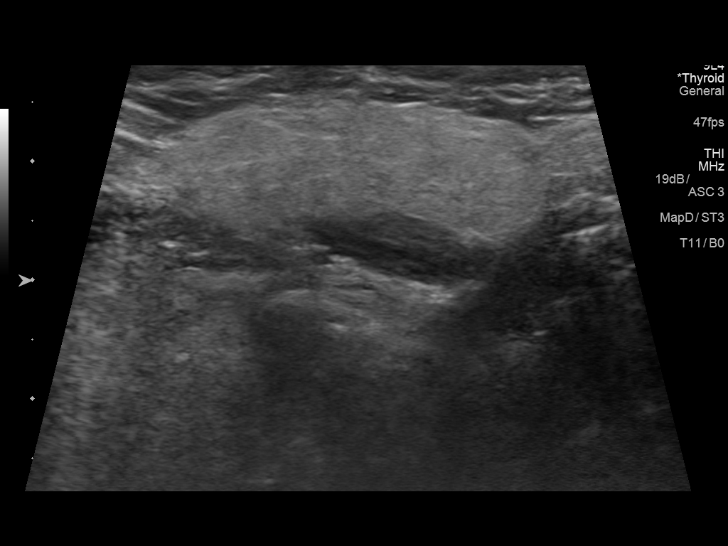
[im 37/37]
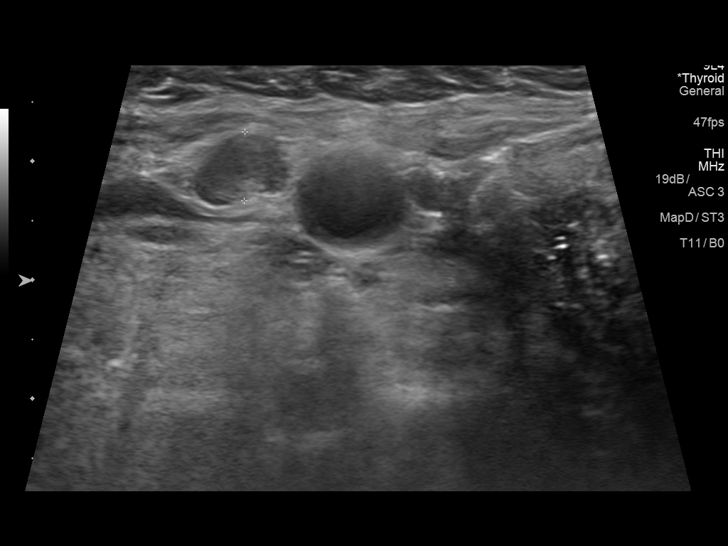

[14 of 25 positions shown; findings below may reference images not displayed]

FINDINGS: Parenchymal Echotexture: Moderately heterogenous, hyperemic

Isthmus: 1.2 cm thickness, previously 1 cm

Right lobe: 8 x 2.6 x 3.9 cm, previously 7.1 x 2.5 x

Left lobe: 6.2 x 2.4 x 3.1 cm, previously 5.5 x 1.7 x

_________________________________________________________

Estimated total number of nodules >/= 1 cm: 0

Number of spongiform nodules >/=  2 cm not described below (TR1): 0

Number of mixed cystic and solid nodules >/= 1.5 cm not described
below (TR2): 0

_________________________________________________________

No discrete nodules, including inferior right lesion previously
biopsied, are not discretely identified within the thyroid gland.
IMPRESSION: 1. Progressive thyromegaly without discrete or dominant lesion.
No indication for biopsy or dedicated imaging follow-up.

The above is in keeping with the ACR TI-RADS recommendations - [HOSPITAL] 7660;[DATE].

## 2019-10-24 ENCOUNTER — Other Ambulatory Visit: Payer: Self-pay | Admitting: Neurology

## 2019-11-03 ENCOUNTER — Ambulatory Visit: Payer: Medicare Other

## 2019-11-25 DIAGNOSIS — N390 Urinary tract infection, site not specified: Secondary | ICD-10-CM | POA: Diagnosis not present

## 2019-11-25 DIAGNOSIS — D539 Nutritional anemia, unspecified: Secondary | ICD-10-CM | POA: Diagnosis not present

## 2019-11-25 DIAGNOSIS — I10 Essential (primary) hypertension: Secondary | ICD-10-CM | POA: Diagnosis not present

## 2019-11-25 DIAGNOSIS — E049 Nontoxic goiter, unspecified: Secondary | ICD-10-CM | POA: Diagnosis not present

## 2019-11-25 DIAGNOSIS — E039 Hypothyroidism, unspecified: Secondary | ICD-10-CM | POA: Diagnosis not present

## 2019-11-30 DIAGNOSIS — F5104 Psychophysiologic insomnia: Secondary | ICD-10-CM | POA: Diagnosis not present

## 2019-11-30 DIAGNOSIS — R9431 Abnormal electrocardiogram [ECG] [EKG]: Secondary | ICD-10-CM | POA: Diagnosis not present

## 2019-11-30 DIAGNOSIS — E039 Hypothyroidism, unspecified: Secondary | ICD-10-CM | POA: Diagnosis not present

## 2019-11-30 DIAGNOSIS — Z853 Personal history of malignant neoplasm of breast: Secondary | ICD-10-CM | POA: Diagnosis not present

## 2019-11-30 DIAGNOSIS — I10 Essential (primary) hypertension: Secondary | ICD-10-CM | POA: Diagnosis not present

## 2019-11-30 DIAGNOSIS — H35039 Hypertensive retinopathy, unspecified eye: Secondary | ICD-10-CM | POA: Diagnosis not present

## 2019-11-30 DIAGNOSIS — G5601 Carpal tunnel syndrome, right upper limb: Secondary | ICD-10-CM | POA: Diagnosis not present

## 2019-11-30 DIAGNOSIS — D539 Nutritional anemia, unspecified: Secondary | ICD-10-CM | POA: Diagnosis not present

## 2019-11-30 DIAGNOSIS — Z0000 Encounter for general adult medical examination without abnormal findings: Secondary | ICD-10-CM | POA: Diagnosis not present

## 2019-12-15 DIAGNOSIS — I10 Essential (primary) hypertension: Secondary | ICD-10-CM | POA: Diagnosis not present

## 2019-12-15 DIAGNOSIS — E049 Nontoxic goiter, unspecified: Secondary | ICD-10-CM | POA: Diagnosis not present

## 2019-12-15 DIAGNOSIS — E039 Hypothyroidism, unspecified: Secondary | ICD-10-CM | POA: Diagnosis not present

## 2019-12-16 ENCOUNTER — Other Ambulatory Visit: Payer: Self-pay | Admitting: Endocrinology

## 2019-12-16 DIAGNOSIS — E049 Nontoxic goiter, unspecified: Secondary | ICD-10-CM

## 2019-12-23 DIAGNOSIS — R0989 Other specified symptoms and signs involving the circulatory and respiratory systems: Secondary | ICD-10-CM | POA: Diagnosis not present

## 2019-12-23 DIAGNOSIS — R55 Syncope and collapse: Secondary | ICD-10-CM | POA: Diagnosis not present

## 2019-12-24 ENCOUNTER — Other Ambulatory Visit: Payer: Self-pay

## 2019-12-24 ENCOUNTER — Encounter: Payer: Self-pay | Admitting: Neurology

## 2019-12-24 ENCOUNTER — Ambulatory Visit: Payer: Medicare PPO | Admitting: Neurology

## 2019-12-24 VITALS — BP 162/83 | HR 71 | Temp 97.9°F | Ht 65.0 in | Wt 148.0 lb

## 2019-12-24 DIAGNOSIS — G8929 Other chronic pain: Secondary | ICD-10-CM | POA: Insufficient documentation

## 2019-12-24 DIAGNOSIS — M545 Low back pain, unspecified: Secondary | ICD-10-CM

## 2019-12-24 DIAGNOSIS — G5601 Carpal tunnel syndrome, right upper limb: Secondary | ICD-10-CM | POA: Diagnosis not present

## 2019-12-24 DIAGNOSIS — R202 Paresthesia of skin: Secondary | ICD-10-CM

## 2019-12-24 MED ORDER — GABAPENTIN 300 MG PO CAPS: 300.0000 mg | ORAL_CAPSULE | Freq: Four times a day (QID) | ORAL | 11 refills | Status: DC

## 2019-12-24 MED ORDER — OXYCODONE-ACETAMINOPHEN 5-325 MG PO TABS: 1.0000 | ORAL_TABLET | Freq: Three times a day (TID) | ORAL | 0 refills | Status: DC | PRN

## 2019-12-24 NOTE — Patient Instructions (Signed)
[address] [place], [place] [address] Phone: [phone]  Dr. Clydell Hakim
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
## 2020-01-06 ENCOUNTER — Ambulatory Visit: Payer: Self-pay | Admitting: Neurology

## 2020-01-07 ENCOUNTER — Telehealth: Payer: Self-pay | Admitting: Neurology

## 2020-01-07 NOTE — Telephone Encounter (Signed)
Patient called to check the status of her home health . Rep for [facility] is not the any more . I was not aware of this . [facility] can take her on as a patient but I will have to call them back on 01/12/2020 to remind them because they are short staff. Patient is aware and fine with this .

## 2020-01-12 NOTE — Telephone Encounter (Signed)
Patient called me back and she is ok with going to [facility] . Patient understood details . Referral has been faxed.

## 2020-01-13 DIAGNOSIS — D225 Melanocytic nevi of trunk: Secondary | ICD-10-CM | POA: Diagnosis not present

## 2020-01-13 DIAGNOSIS — L820 Inflamed seborrheic keratosis: Secondary | ICD-10-CM | POA: Diagnosis not present

## 2020-01-13 DIAGNOSIS — Z1283 Encounter for screening for malignant neoplasm of skin: Secondary | ICD-10-CM | POA: Diagnosis not present

## 2020-01-13 DIAGNOSIS — L218 Other seborrheic dermatitis: Secondary | ICD-10-CM | POA: Diagnosis not present

## 2020-01-18 DIAGNOSIS — I10 Essential (primary) hypertension: Secondary | ICD-10-CM | POA: Diagnosis not present

## 2020-01-18 DIAGNOSIS — M4124 Other idiopathic scoliosis, thoracic region: Secondary | ICD-10-CM | POA: Diagnosis not present

## 2020-01-18 DIAGNOSIS — M6281 Muscle weakness (generalized): Secondary | ICD-10-CM | POA: Diagnosis not present

## 2020-01-18 DIAGNOSIS — R269 Unspecified abnormalities of gait and mobility: Secondary | ICD-10-CM | POA: Diagnosis not present

## 2020-01-18 DIAGNOSIS — M545 Low back pain: Secondary | ICD-10-CM | POA: Diagnosis not present

## 2020-01-21 DIAGNOSIS — M4124 Other idiopathic scoliosis, thoracic region: Secondary | ICD-10-CM | POA: Diagnosis not present

## 2020-01-21 DIAGNOSIS — M545 Low back pain: Secondary | ICD-10-CM | POA: Diagnosis not present

## 2020-01-21 DIAGNOSIS — I10 Essential (primary) hypertension: Secondary | ICD-10-CM | POA: Diagnosis not present

## 2020-01-21 DIAGNOSIS — R269 Unspecified abnormalities of gait and mobility: Secondary | ICD-10-CM | POA: Diagnosis not present

## 2020-01-21 DIAGNOSIS — M6281 Muscle weakness (generalized): Secondary | ICD-10-CM | POA: Diagnosis not present

## 2020-01-25 DIAGNOSIS — M545 Low back pain: Secondary | ICD-10-CM | POA: Diagnosis not present

## 2020-01-25 DIAGNOSIS — M4124 Other idiopathic scoliosis, thoracic region: Secondary | ICD-10-CM | POA: Diagnosis not present

## 2020-01-25 DIAGNOSIS — R269 Unspecified abnormalities of gait and mobility: Secondary | ICD-10-CM | POA: Diagnosis not present

## 2020-01-25 DIAGNOSIS — M6281 Muscle weakness (generalized): Secondary | ICD-10-CM | POA: Diagnosis not present

## 2020-01-25 DIAGNOSIS — I10 Essential (primary) hypertension: Secondary | ICD-10-CM | POA: Diagnosis not present

## 2020-01-27 DIAGNOSIS — D3705 Neoplasm of uncertain behavior of pharynx: Secondary | ICD-10-CM | POA: Diagnosis not present

## 2020-01-28 DIAGNOSIS — M6281 Muscle weakness (generalized): Secondary | ICD-10-CM | POA: Diagnosis not present

## 2020-01-28 DIAGNOSIS — M545 Low back pain: Secondary | ICD-10-CM | POA: Diagnosis not present

## 2020-01-28 DIAGNOSIS — I10 Essential (primary) hypertension: Secondary | ICD-10-CM | POA: Diagnosis not present

## 2020-01-28 DIAGNOSIS — R269 Unspecified abnormalities of gait and mobility: Secondary | ICD-10-CM | POA: Diagnosis not present

## 2020-01-28 DIAGNOSIS — M4124 Other idiopathic scoliosis, thoracic region: Secondary | ICD-10-CM | POA: Diagnosis not present

## 2020-02-10 DIAGNOSIS — M4120 Other idiopathic scoliosis, site unspecified: Secondary | ICD-10-CM | POA: Diagnosis not present

## 2020-02-10 DIAGNOSIS — I10 Essential (primary) hypertension: Secondary | ICD-10-CM | POA: Diagnosis not present

## 2020-02-10 DIAGNOSIS — M48062 Spinal stenosis, lumbar region with neurogenic claudication: Secondary | ICD-10-CM | POA: Diagnosis not present

## 2020-02-16 DIAGNOSIS — M48062 Spinal stenosis, lumbar region with neurogenic claudication: Secondary | ICD-10-CM | POA: Diagnosis not present

## 2020-02-16 DIAGNOSIS — E039 Hypothyroidism, unspecified: Secondary | ICD-10-CM | POA: Diagnosis not present

## 2020-03-29 DIAGNOSIS — M810 Age-related osteoporosis without current pathological fracture: Secondary | ICD-10-CM | POA: Diagnosis not present

## 2020-03-29 DIAGNOSIS — M48062 Spinal stenosis, lumbar region with neurogenic claudication: Secondary | ICD-10-CM | POA: Diagnosis not present

## 2020-03-29 DIAGNOSIS — I10 Essential (primary) hypertension: Secondary | ICD-10-CM | POA: Diagnosis not present

## 2020-03-30 DIAGNOSIS — M79671 Pain in right foot: Secondary | ICD-10-CM | POA: Diagnosis not present

## 2020-04-18 DIAGNOSIS — H26491 Other secondary cataract, right eye: Secondary | ICD-10-CM | POA: Diagnosis not present

## 2020-04-18 DIAGNOSIS — E119 Type 2 diabetes mellitus without complications: Secondary | ICD-10-CM | POA: Diagnosis not present

## 2020-04-18 DIAGNOSIS — H26493 Other secondary cataract, bilateral: Secondary | ICD-10-CM | POA: Diagnosis not present

## 2020-04-18 DIAGNOSIS — H35363 Drusen (degenerative) of macula, bilateral: Secondary | ICD-10-CM | POA: Diagnosis not present

## 2020-04-18 DIAGNOSIS — H35033 Hypertensive retinopathy, bilateral: Secondary | ICD-10-CM | POA: Diagnosis not present

## 2020-04-26 DIAGNOSIS — E039 Hypothyroidism, unspecified: Secondary | ICD-10-CM | POA: Diagnosis not present

## 2020-04-27 DIAGNOSIS — D3705 Neoplasm of uncertain behavior of pharynx: Secondary | ICD-10-CM | POA: Diagnosis not present

## 2020-06-22 DIAGNOSIS — M79671 Pain in right foot: Secondary | ICD-10-CM | POA: Diagnosis not present

## 2020-07-05 DIAGNOSIS — E039 Hypothyroidism, unspecified: Secondary | ICD-10-CM | POA: Diagnosis not present

## 2020-07-06 DIAGNOSIS — Z23 Encounter for immunization: Secondary | ICD-10-CM | POA: Diagnosis not present

## 2020-07-11 ENCOUNTER — Other Ambulatory Visit: Payer: Self-pay | Admitting: Endocrinology

## 2020-07-11 DIAGNOSIS — E049 Nontoxic goiter, unspecified: Secondary | ICD-10-CM

## 2020-07-11 DIAGNOSIS — E039 Hypothyroidism, unspecified: Secondary | ICD-10-CM | POA: Diagnosis not present

## 2020-07-20 ENCOUNTER — Ambulatory Visit
Admission: RE | Admit: 2020-07-20 | Discharge: 2020-07-20 | Disposition: A | Discharge status: Home / Self Care | Payer: Medicare PPO | Source: Ambulatory Visit | Attending: Endocrinology | Admitting: Endocrinology

## 2020-07-20 DIAGNOSIS — E049 Nontoxic goiter, unspecified: Secondary | ICD-10-CM | POA: Diagnosis not present

## 2020-07-26 DIAGNOSIS — L821 Other seborrheic keratosis: Secondary | ICD-10-CM | POA: Diagnosis not present

## 2020-07-26 DIAGNOSIS — L650 Telogen effluvium: Secondary | ICD-10-CM | POA: Diagnosis not present

## 2020-07-26 DIAGNOSIS — D225 Melanocytic nevi of trunk: Secondary | ICD-10-CM | POA: Diagnosis not present

## 2020-07-26 DIAGNOSIS — L918 Other hypertrophic disorders of the skin: Secondary | ICD-10-CM | POA: Diagnosis not present

## 2020-07-26 DIAGNOSIS — Z1283 Encounter for screening for malignant neoplasm of skin: Secondary | ICD-10-CM | POA: Diagnosis not present

## 2020-07-27 DIAGNOSIS — Z79899 Other long term (current) drug therapy: Secondary | ICD-10-CM | POA: Diagnosis not present

## 2020-07-27 DIAGNOSIS — L650 Telogen effluvium: Secondary | ICD-10-CM | POA: Diagnosis not present

## 2020-08-09 DIAGNOSIS — M19079 Primary osteoarthritis, unspecified ankle and foot: Secondary | ICD-10-CM | POA: Diagnosis not present

## 2020-08-18 ENCOUNTER — Encounter: Payer: Self-pay | Admitting: Obstetrics and Gynecology

## 2020-08-18 ENCOUNTER — Other Ambulatory Visit: Payer: Self-pay

## 2020-08-18 ENCOUNTER — Encounter: Payer: Medicare Other | Admitting: Gynecology

## 2020-08-18 ENCOUNTER — Ambulatory Visit (INDEPENDENT_AMBULATORY_CARE_PROVIDER_SITE_OTHER): Payer: Medicare PPO | Admitting: Obstetrics and Gynecology

## 2020-08-18 VITALS — BP 122/74 | Ht 65.0 in | Wt 151.0 lb

## 2020-08-18 DIAGNOSIS — Z01419 Encounter for gynecological examination (general) (routine) without abnormal findings: Secondary | ICD-10-CM

## 2020-08-18 DIAGNOSIS — Z853 Personal history of malignant neoplasm of breast: Secondary | ICD-10-CM

## 2020-08-18 DIAGNOSIS — M810 Age-related osteoporosis without current pathological fracture: Secondary | ICD-10-CM

## 2020-08-18 NOTE — Progress Notes (Signed)
   Gwendolyn Rowe [birth year] [id number]  SUBJECTIVE:  86 y.o. G2P2002 female here for a breast and pelvic exam. She has no gynecologic concerns.  Current Outpatient Medications  Medication Sig Dispense Refill  . Biotin 5000 MCG CAPS Take by mouth.    Marland Kitchen CALCIUM PO Take 500 mg by mouth 2 (two) times daily.    . Cholecalciferol (VITAMIN D) 2000 UNITS CAPS Take by mouth.    . denosumab (PROLIA) 60 MG/ML SOSY injection Inject 60 mg into the skin every 6 (six) months.    . gabapentin (NEURONTIN) 300 MG capsule Take 1 capsule (300 mg total) by mouth 4 (four) times daily. 120 capsule 11  . glucosamine-chondroitin 500-400 MG tablet Take 1 tablet by mouth 3 (three) times daily.    Marland Kitchen levothyroxine (SYNTHROID) 125 MCG tablet Take 125 mcg by mouth daily before breakfast.    . losartan (COZAAR) 50 MG tablet Take 50 mg daily by mouth.    Marland Kitchen MAGNESIUM PO Take 500 mg by mouth.    . Multiple Vitamin (MULTIVITAMIN) tablet Take 1 tablet by mouth daily.    . Omega-3 1000 MG CAPS Take 1 capsule by mouth daily.    Marland Kitchen oxyCODONE-acetaminophen (PERCOCET) 5-325 MG tablet Take 1 tablet by mouth every 8 (eight) hours as needed for severe pain. (Patient not taking: Reported on 08/18/2020) 30 tablet 0   No current facility-administered medications for this visit.   Allergies: Prednisone and Tramadol  No LMP recorded. Patient is postmenopausal.  Past medical history,surgical history, problem list, medications, allergies, family history and social history were all reviewed and documented as reviewed in the EPIC chart.  GYN ROS: no abnormal bleeding, pelvic pain or discharge, no breast pain or new or enlarging lumps on self exam.  No dysuria, urinary frequency, pain with urination, cloudy/malodorous urine.   OBJECTIVE:  BP 122/74   Ht 5\' 5"  (1.651 m)   Wt 151 lb (68.5 kg)   BMI 25.13 kg/m  The patient appears well, alert, oriented, in no distress.  BREAST EXAM: Prior right mastectomy, left breast appears  normal, bilaterally there are no suspicious masses, no skin or nipple area changes or axillary nodes  PELVIC EXAM: VULVA: normal appearing vulva with atrophic change, no masses, tenderness or lesions, VAGINA: normal appearing vagina with atrophic change, normal color and discharge, no lesions, CERVIX: normal appearing atrophic cervix without discharge or lesions, UTERUS: uterus is normal size, shape, consistency and nontender, ADNEXA: normal adnexa in size, nontender and no masses  Chaperone: [name] present during the examination  ASSESSMENT:  86 y.o. [id number] here for a breast and pelvic exam  PLAN:   1. Postmenopausal. No significant hot flashes or night sweats. No vaginal bleeding. 2. Pap smear 2009. No significant history of abnormal Pap smears. Following the current screening guidelines she has been comfortable with not continuing with Pap smears. 3. History of right breast cancer, status post right mastectomy. Mammogram 09/2019.  Normal breast exam/NED today.  She is reminded to schedule an annual mammogram this year when due. 4. Colonoscopy 2014.  She will follow up at the interval recommended by her GI specialist.  5. Osteoporosis. DEXA 2017. Follows with Dr. Shelia Media for bone health and remains on Prolia. 6. Health maintenance.  No labs today as she normally has these completed elsewhere.  Return 1-2 years or sooner, prn.  Joseph Pierini MD 08/18/20

## 2020-09-07 ENCOUNTER — Telehealth: Payer: Self-pay | Admitting: Neurology

## 2020-09-07 MED ORDER — GABAPENTIN 100 MG PO CAPS: ORAL_CAPSULE | ORAL | 5 refills | Status: DC

## 2020-09-07 NOTE — Telephone Encounter (Signed)
Pt called stating she is needing to discuss the dosage on her gabapentin (NEURONTIN) 300 MG capsule She states that she is needing to lower the night time dosage. Please advise.

## 2020-09-07 NOTE — Telephone Encounter (Signed)
The patient is currently taking gabapentin 300mg , two capsules at bedtime. The prescription allows her up to four capsules per day. She is unable to take the higher dose during the middle of night or during the day due to excessive drowsiness. She would like new rx for gabapentin 100mg , three capsules TID. This will give her the flexibility to take it as she needs it. She will likely continue the 600mg  at [facility] and just supplement that dosage occasionally with an additional 100mg  capsule.   Per vo by Dr. Krista Blue, okay to provide the new prescription.

## 2020-09-26 DIAGNOSIS — L304 Erythema intertrigo: Secondary | ICD-10-CM | POA: Diagnosis not present

## 2020-09-26 DIAGNOSIS — L820 Inflamed seborrheic keratosis: Secondary | ICD-10-CM | POA: Diagnosis not present

## 2020-10-03 DIAGNOSIS — M810 Age-related osteoporosis without current pathological fracture: Secondary | ICD-10-CM | POA: Diagnosis not present

## 2020-10-27 DIAGNOSIS — H35033 Hypertensive retinopathy, bilateral: Secondary | ICD-10-CM | POA: Diagnosis not present

## 2020-10-27 DIAGNOSIS — H35013 Changes in retinal vascular appearance, bilateral: Secondary | ICD-10-CM | POA: Diagnosis not present

## 2020-10-27 DIAGNOSIS — H524 Presbyopia: Secondary | ICD-10-CM | POA: Diagnosis not present

## 2020-10-27 DIAGNOSIS — H35363 Drusen (degenerative) of macula, bilateral: Secondary | ICD-10-CM | POA: Diagnosis not present

## 2020-10-27 DIAGNOSIS — H04123 Dry eye syndrome of bilateral lacrimal glands: Secondary | ICD-10-CM | POA: Diagnosis not present

## 2020-10-27 DIAGNOSIS — H26492 Other secondary cataract, left eye: Secondary | ICD-10-CM | POA: Diagnosis not present

## 2020-10-27 DIAGNOSIS — H16223 Keratoconjunctivitis sicca, not specified as Sjogren's, bilateral: Secondary | ICD-10-CM | POA: Diagnosis not present

## 2020-11-02 DIAGNOSIS — D3705 Neoplasm of uncertain behavior of pharynx: Secondary | ICD-10-CM | POA: Diagnosis not present

## 2020-11-30 ENCOUNTER — Ambulatory Visit: Payer: Medicare PPO | Admitting: Psychiatry

## 2020-11-30 DIAGNOSIS — M545 Low back pain, unspecified: Secondary | ICD-10-CM | POA: Diagnosis not present

## 2020-12-06 DIAGNOSIS — M545 Low back pain, unspecified: Secondary | ICD-10-CM | POA: Diagnosis not present

## 2020-12-06 DIAGNOSIS — M5459 Other low back pain: Secondary | ICD-10-CM | POA: Diagnosis not present

## 2020-12-21 DIAGNOSIS — M5459 Other low back pain: Secondary | ICD-10-CM | POA: Diagnosis not present

## 2021-01-02 DIAGNOSIS — E039 Hypothyroidism, unspecified: Secondary | ICD-10-CM | POA: Diagnosis not present

## 2021-01-09 DIAGNOSIS — E039 Hypothyroidism, unspecified: Secondary | ICD-10-CM | POA: Diagnosis not present

## 2021-01-18 DIAGNOSIS — M545 Low back pain, unspecified: Secondary | ICD-10-CM | POA: Diagnosis not present

## 2021-02-13 DIAGNOSIS — M545 Low back pain, unspecified: Secondary | ICD-10-CM | POA: Diagnosis not present

## 2021-02-17 DIAGNOSIS — M545 Low back pain, unspecified: Secondary | ICD-10-CM | POA: Diagnosis not present

## 2021-02-20 DIAGNOSIS — M545 Low back pain, unspecified: Secondary | ICD-10-CM | POA: Diagnosis not present

## 2021-02-21 DIAGNOSIS — M810 Age-related osteoporosis without current pathological fracture: Secondary | ICD-10-CM | POA: Diagnosis not present

## 2021-02-21 DIAGNOSIS — I10 Essential (primary) hypertension: Secondary | ICD-10-CM | POA: Diagnosis not present

## 2021-02-23 DIAGNOSIS — M545 Low back pain, unspecified: Secondary | ICD-10-CM | POA: Diagnosis not present

## 2021-02-28 DIAGNOSIS — M21371 Foot drop, right foot: Secondary | ICD-10-CM | POA: Diagnosis not present

## 2021-02-28 DIAGNOSIS — M545 Low back pain, unspecified: Secondary | ICD-10-CM | POA: Diagnosis not present

## 2021-02-28 DIAGNOSIS — H35039 Hypertensive retinopathy, unspecified eye: Secondary | ICD-10-CM | POA: Diagnosis not present

## 2021-02-28 DIAGNOSIS — Z0000 Encounter for general adult medical examination without abnormal findings: Secondary | ICD-10-CM | POA: Diagnosis not present

## 2021-02-28 DIAGNOSIS — R251 Tremor, unspecified: Secondary | ICD-10-CM | POA: Diagnosis not present

## 2021-02-28 DIAGNOSIS — M810 Age-related osteoporosis without current pathological fracture: Secondary | ICD-10-CM | POA: Diagnosis not present

## 2021-02-28 DIAGNOSIS — E039 Hypothyroidism, unspecified: Secondary | ICD-10-CM | POA: Diagnosis not present

## 2021-03-03 DIAGNOSIS — M545 Low back pain, unspecified: Secondary | ICD-10-CM | POA: Diagnosis not present

## 2021-03-08 DIAGNOSIS — M545 Low back pain, unspecified: Secondary | ICD-10-CM | POA: Diagnosis not present

## 2021-03-14 DIAGNOSIS — E039 Hypothyroidism, unspecified: Secondary | ICD-10-CM | POA: Diagnosis not present

## 2021-03-15 DIAGNOSIS — M545 Low back pain, unspecified: Secondary | ICD-10-CM | POA: Diagnosis not present

## 2021-03-17 DIAGNOSIS — M545 Low back pain, unspecified: Secondary | ICD-10-CM | POA: Diagnosis not present

## 2021-03-22 DIAGNOSIS — M21371 Foot drop, right foot: Secondary | ICD-10-CM | POA: Diagnosis not present

## 2021-03-29 DIAGNOSIS — M545 Low back pain, unspecified: Secondary | ICD-10-CM | POA: Diagnosis not present

## 2021-04-04 DIAGNOSIS — M545 Low back pain, unspecified: Secondary | ICD-10-CM | POA: Diagnosis not present

## 2021-04-05 DIAGNOSIS — M810 Age-related osteoporosis without current pathological fracture: Secondary | ICD-10-CM | POA: Diagnosis not present

## 2021-04-11 DIAGNOSIS — D539 Nutritional anemia, unspecified: Secondary | ICD-10-CM | POA: Diagnosis not present

## 2021-04-11 DIAGNOSIS — I6523 Occlusion and stenosis of bilateral carotid arteries: Secondary | ICD-10-CM | POA: Diagnosis not present

## 2021-04-13 DIAGNOSIS — I6523 Occlusion and stenosis of bilateral carotid arteries: Secondary | ICD-10-CM | POA: Diagnosis not present

## 2021-04-13 DIAGNOSIS — M810 Age-related osteoporosis without current pathological fracture: Secondary | ICD-10-CM | POA: Diagnosis not present

## 2021-04-13 DIAGNOSIS — E7800 Pure hypercholesterolemia, unspecified: Secondary | ICD-10-CM | POA: Diagnosis not present

## 2021-04-13 DIAGNOSIS — I10 Essential (primary) hypertension: Secondary | ICD-10-CM | POA: Diagnosis not present

## 2021-04-27 DIAGNOSIS — M21371 Foot drop, right foot: Secondary | ICD-10-CM | POA: Diagnosis not present

## 2021-04-27 DIAGNOSIS — M545 Low back pain, unspecified: Secondary | ICD-10-CM | POA: Diagnosis not present

## 2021-05-01 ENCOUNTER — Ambulatory Visit: Payer: Medicare PPO | Admitting: Neurology

## 2021-05-01 ENCOUNTER — Encounter: Payer: Self-pay | Admitting: Neurology

## 2021-05-01 VITALS — BP 133/74 | HR 67 | Ht 66.0 in | Wt 144.0 lb

## 2021-05-01 DIAGNOSIS — M21371 Foot drop, right foot: Secondary | ICD-10-CM

## 2021-05-01 DIAGNOSIS — G5601 Carpal tunnel syndrome, right upper limb: Secondary | ICD-10-CM

## 2021-05-01 DIAGNOSIS — R269 Unspecified abnormalities of gait and mobility: Secondary | ICD-10-CM | POA: Diagnosis not present
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
## 2021-05-04 ENCOUNTER — Ambulatory Visit: Payer: Medicare PPO | Admitting: Neurology

## 2021-05-08 ENCOUNTER — Telehealth: Payer: Self-pay | Admitting: *Deleted

## 2021-05-08 NOTE — Telephone Encounter (Signed)
R/c pt cd and report from [facility]. Place in nurse pod

## 2021-05-10 DIAGNOSIS — M21371 Foot drop, right foot: Secondary | ICD-10-CM | POA: Diagnosis not present

## 2021-05-10 DIAGNOSIS — M545 Low back pain, unspecified: Secondary | ICD-10-CM | POA: Diagnosis not present

## 2021-05-24 ENCOUNTER — Telehealth: Payer: Self-pay | Admitting: Neurology

## 2021-05-24 DIAGNOSIS — M21371 Foot drop, right foot: Secondary | ICD-10-CM | POA: Diagnosis not present

## 2021-05-24 DIAGNOSIS — M545 Low back pain, unspecified: Secondary | ICD-10-CM | POA: Diagnosis not present

## 2021-05-24 NOTE — Telephone Encounter (Signed)
I spoke to the patient and reviewed the MRI findings below. She verbalized understanding and will keep her pending appt for NCV/EMG.

## 2021-05-24 NOTE — Telephone Encounter (Signed)
Please call patient, I was able to review MRI from [facility] on Feb 20, 2021, evidence of compression fracture at L1, moderate central canal stenosis at L2-3,  She has EMG nerve conduction study pending on June 28, 2021,  Addendum: MRI of the lumbar report from [facility] on Feb 20, 2021, moderate, recent appearing compression fracture at L1 without retropulsion of bone, fracture line is noted along the superior endplate and marrow edema is evident throughout the vertebral body, the fracture is overall benign in appearance, likely secondary to osteoporosis and or trauma, mild disc bulging large central disc extrusion at L2-3, causing moderate central stenosis with impingement upon both L3 nerve roots, greater on the left, moderate disc bulging at other lumbar level with no additional site of significant central stenosis

## 2021-05-26 DIAGNOSIS — M545 Low back pain, unspecified: Secondary | ICD-10-CM | POA: Diagnosis not present

## 2021-05-26 DIAGNOSIS — M21371 Foot drop, right foot: Secondary | ICD-10-CM | POA: Diagnosis not present

## 2021-05-29 DIAGNOSIS — M21371 Foot drop, right foot: Secondary | ICD-10-CM | POA: Diagnosis not present

## 2021-05-29 DIAGNOSIS — M545 Low back pain, unspecified: Secondary | ICD-10-CM | POA: Diagnosis not present

## 2021-06-02 DIAGNOSIS — M545 Low back pain, unspecified: Secondary | ICD-10-CM | POA: Diagnosis not present

## 2021-06-02 DIAGNOSIS — M21371 Foot drop, right foot: Secondary | ICD-10-CM | POA: Diagnosis not present

## 2021-06-06 DIAGNOSIS — M21371 Foot drop, right foot: Secondary | ICD-10-CM | POA: Diagnosis not present

## 2021-06-06 DIAGNOSIS — M545 Low back pain, unspecified: Secondary | ICD-10-CM | POA: Diagnosis not present

## 2021-06-08 DIAGNOSIS — M21371 Foot drop, right foot: Secondary | ICD-10-CM | POA: Diagnosis not present

## 2021-06-08 DIAGNOSIS — M5459 Other low back pain: Secondary | ICD-10-CM | POA: Diagnosis not present

## 2021-06-08 DIAGNOSIS — M545 Low back pain, unspecified: Secondary | ICD-10-CM | POA: Diagnosis not present

## 2021-06-13 DIAGNOSIS — M21371 Foot drop, right foot: Secondary | ICD-10-CM | POA: Diagnosis not present

## 2021-06-13 DIAGNOSIS — M545 Low back pain, unspecified: Secondary | ICD-10-CM | POA: Diagnosis not present

## 2021-06-15 DIAGNOSIS — M545 Low back pain, unspecified: Secondary | ICD-10-CM | POA: Diagnosis not present

## 2021-06-15 DIAGNOSIS — M21371 Foot drop, right foot: Secondary | ICD-10-CM | POA: Diagnosis not present

## 2021-06-21 DIAGNOSIS — M21371 Foot drop, right foot: Secondary | ICD-10-CM | POA: Diagnosis not present

## 2021-06-21 DIAGNOSIS — M545 Low back pain, unspecified: Secondary | ICD-10-CM | POA: Diagnosis not present

## 2021-06-23 DIAGNOSIS — M545 Low back pain, unspecified: Secondary | ICD-10-CM | POA: Diagnosis not present

## 2021-06-23 DIAGNOSIS — M21371 Foot drop, right foot: Secondary | ICD-10-CM | POA: Diagnosis not present

## 2021-06-28 ENCOUNTER — Ambulatory Visit (INDEPENDENT_AMBULATORY_CARE_PROVIDER_SITE_OTHER): Payer: Medicare PPO | Admitting: Neurology

## 2021-06-28 ENCOUNTER — Other Ambulatory Visit: Payer: Self-pay

## 2021-06-28 ENCOUNTER — Ambulatory Visit: Payer: Medicare PPO | Admitting: Neurology

## 2021-06-28 DIAGNOSIS — G5601 Carpal tunnel syndrome, right upper limb: Secondary | ICD-10-CM

## 2021-06-28 DIAGNOSIS — M21371 Foot drop, right foot: Secondary | ICD-10-CM | POA: Diagnosis not present

## 2021-06-28 DIAGNOSIS — R269 Unspecified abnormalities of gait and mobility: Secondary | ICD-10-CM | POA: Diagnosis not present

## 2021-06-28 DIAGNOSIS — G6289 Other specified polyneuropathies: Secondary | ICD-10-CM

## 2021-06-28 DIAGNOSIS — M5416 Radiculopathy, lumbar region: Secondary | ICD-10-CM | POA: Diagnosis not present

## 2021-06-28 DIAGNOSIS — G629 Polyneuropathy, unspecified: Secondary | ICD-10-CM | POA: Insufficient documentation
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
## 2021-06-28 NOTE — Procedures (Signed)
Full Name: Gwendolyn Rowe Gender: Female MRN #: [id number] Date of Birth: [birth year]    Visit Date: 06/28/2021 08:32 Age: 87 Years Examining Physician: Marcial Pacas, MD  Referring Physician: Marcial Pacas, MD History: 87 year old female, presented with worsening gait abnormality, history of pelvic fracture, required surgery in the past, also significant lumbar degenerative disease  Summary of the test: Nerve conduction study: Right sural, bilateral superficial peroneal, right median sensory responses were absent.  Left sural sensory response showed moderately decreased snap amplitude.  Right ulnar sensory responses were within normal limit.  Bilateral tibial, peroneal to EDB motor responses showed significantly decreased CMAP amplitude.  Right ulnar motor responses were normal.  Right median motor responses showed moderately prolonged distal latency, with mildly decreased CMAP amplitude, normal conduction velocity.  Tibial H reflexes: Right side is absent, left side was present  Electromyography: Selected needle examination of bilateral lower extremity muscles and bilateral lumbosacral paraspinal muscles were performed.  There is evidence of mild chronic neuropathic changes involving bilateral L4, 5, S1 myotomes, right slightly worse than left. There is also evidence of increased insertional activity, along bilateral lower lumbar paraspinal muscles, right worse than left.  Conclusion: This is an abnormal study.  There is electrodiagnostic evidence of mild chronic bilateral lumbosacral radiculopathies, mainly involving bilateral L4-5 S1 myotomes, right slightly worse than left.  In addition, there is evidence of mild length dependent axonal sensorimotor neuropathy, and severe right carpal tunnel syndromes.    ------------------------------- Marcial Pacas M.D. PhD  [facility] [address], [place], [place] [address] Tel: [phone] Fax:  [phone]  Verbal informed consent was obtained from the patient, patient was informed of potential risk of procedure, including bruising, bleeding, hematoma formation, infection, muscle weakness, muscle pain, numbness, among others.        [place]    Nerve / Sites Muscle Latency Ref. Amplitude Ref. Rel Amp Segments Distance Velocity Ref. Area    ms ms mV mV %  cm m/s m/s mVms  R Median - APB     Wrist APB 6.1 ?4.4 3.6 ?4.0 100 Wrist - APB 7   16.1     Upper arm APB 10.6  3.4  94.2 Upper arm - Wrist 23 51 ?49 18.0     Ulnar Wrist APB 3.3  2.0  59.1 Ulnar Wrist - APB    9.1  R Ulnar - ADM     Wrist ADM 3.0 ?3.3 7.0 ?6.0 100 Wrist - ADM 7   18.5     B.Elbow ADM 6.8  6.4  91.4 B.Elbow - Wrist 19 50 ?49 16.8     A.Elbow ADM 8.8  5.9  92.8 A.Elbow - B.Elbow 10 50 ?49 18.0  L Peroneal - EDB     Ankle EDB 3.8 ?6.5 0.9 ?2.0 100 Ankle - EDB 9   3.8     Fib head EDB 11.3  0.9  109 Fib head - Ankle 29 39 ?44 3.4     Pop fossa EDB 14.4  1.0  103 Pop fossa - Fib head 10 32 ?44 3.2         Pop fossa - Ankle      R Peroneal - EDB     Ankle EDB 6.5 ?6.5 0.2 ?2.0 100 Ankle - EDB 9   0.8     Fib head EDB 14.9  0.3  147 Fib head - Ankle 28 34 ?44 0.8     Pop fossa EDB 17.8  0.3  112 Pop fossa - Fib head 10 34 ?44 0.9         Pop fossa - Ankle      L Tibial - AH     Ankle AH 3.6 ?5.8 1.8 ?4.0 100 Ankle - AH 9   7.6     Pop fossa AH 13.8  1.2  62.9 Pop fossa - Ankle 38 37 ?41 6.0  R Tibial - AH     Ankle AH 3.8 ?5.8 1.3 ?4.0 100 Ankle - AH 9   3.9     Pop fossa AH 14.3  1.1  78.9 Pop fossa - Ankle 39 37 ?41 4.2                  SNC    Nerve / Sites Rec. Site Peak Lat Ref.  Amp Ref. Segments Distance    ms ms V V  cm  L Sural - Ankle (Calf)     Calf Ankle 3.8 ?4.4 3 ?6 Calf - Ankle 14  R Sural - Ankle (Calf)     Calf Ankle NR ?4.4 NR ?6 Calf - Ankle 14  L Superficial peroneal - Ankle     Lat leg Ankle NR ?4.4 NR ?6 Lat leg - Ankle 14  R Superficial peroneal - Ankle     Lat leg Ankle NR ?4.4  NR ?6 Lat leg - Ankle 14  R Median - Orthodromic (Dig II, Mid palm)     Dig II Wrist NR ?3.4 NR ?10 Dig II - Wrist 13  R Ulnar - Orthodromic, (Dig V, Mid palm)     Dig V Wrist 3.0 ?3.1 5 ?5 Dig V - Wrist 65                 F  Wave    Nerve F Lat Ref.   ms ms  L Tibial - AH 64.4 ?56.0  R Tibial - AH 67.8 ?56.0  R Ulnar - ADM 30.9 ?32.0           H Reflex    Nerve H Lat   ms   Left Right Ref.  Tibial - [place] 41.8 45.1 ?35.0         EMG Summary Table    Spontaneous MUAP Recruitment  Muscle IA Fib PSW Fasc Other Amp Dur. Poly Pattern  R. Tibialis anterior Increased 1+ None None _______ Increased Increased 1+ Reduced  R. Tibialis posterior Increased None None None _______ Increased Increased 1+ Reduced  R. Peroneus longus Increased None None None _______ Increased Increased 1+ Reduced  R. Gastrocnemius (Medial head) Normal None None None _______ Normal Normal Normal Reduced  R. Vastus lateralis Normal None None None _______ Normal Normal Normal Reduced  R. Biceps femoris (short head) Normal None None None _______ Normal Normal Normal Reduced  R. Biceps femoris (long head) Normal None None None _______ Normal Normal Normal Reduced  R. Gluteus medius Normal None None None _______ Normal Normal Normal Normal  R. Lumbar paraspinals (low) Increased 1+ None None _______ Normal Normal Normal Normal  R. Lumbar paraspinals (mid) Increased 1+ None None _______ Normal Normal Normal Normal  L. Tibialis anterior Normal None None None _______ Normal Normal Normal Reduced  L. Tibialis posterior Normal None None None _______ Normal Normal Normal Reduced  L. Peroneus longus Normal None None None _______ Normal Normal Normal Reduced  L. Gastrocnemius (Medial head) Normal None None None _______ Normal Normal Normal Reduced  L. Vastus lateralis Normal None  None None _______ Normal Normal Normal Reduced  L. Lumbar paraspinals (low) Increased 1+ None None _______ Normal Normal Normal Normal  L. Lumbar  paraspinals (mid) Increased 1+ None None _______ Normal Normal Normal Normal

## 2021-07-03 DIAGNOSIS — L821 Other seborrheic keratosis: Secondary | ICD-10-CM | POA: Diagnosis not present

## 2021-07-03 DIAGNOSIS — L570 Actinic keratosis: Secondary | ICD-10-CM | POA: Diagnosis not present

## 2021-07-03 DIAGNOSIS — L89899 Pressure ulcer of other site, unspecified stage: Secondary | ICD-10-CM | POA: Diagnosis not present

## 2021-07-03 DIAGNOSIS — X32XXXD Exposure to sunlight, subsequent encounter: Secondary | ICD-10-CM | POA: Diagnosis not present

## 2021-07-03 DIAGNOSIS — B078 Other viral warts: Secondary | ICD-10-CM | POA: Diagnosis not present

## 2021-07-03 DIAGNOSIS — Z1283 Encounter for screening for malignant neoplasm of skin: Secondary | ICD-10-CM | POA: Diagnosis not present

## 2021-07-12 DIAGNOSIS — M21371 Foot drop, right foot: Secondary | ICD-10-CM | POA: Diagnosis not present

## 2021-07-12 DIAGNOSIS — M545 Low back pain, unspecified: Secondary | ICD-10-CM | POA: Diagnosis not present

## 2021-07-19 DIAGNOSIS — M21371 Foot drop, right foot: Secondary | ICD-10-CM | POA: Diagnosis not present

## 2021-07-19 DIAGNOSIS — M545 Low back pain, unspecified: Secondary | ICD-10-CM | POA: Diagnosis not present

## 2021-07-26 DIAGNOSIS — M545 Low back pain, unspecified: Secondary | ICD-10-CM | POA: Diagnosis not present

## 2021-07-26 DIAGNOSIS — M21371 Foot drop, right foot: Secondary | ICD-10-CM | POA: Diagnosis not present

## 2021-08-14 DIAGNOSIS — M545 Low back pain, unspecified: Secondary | ICD-10-CM | POA: Diagnosis not present

## 2021-08-14 DIAGNOSIS — M21371 Foot drop, right foot: Secondary | ICD-10-CM | POA: Diagnosis not present

## 2021-08-30 DIAGNOSIS — L8990 Pressure ulcer of unspecified site, unspecified stage: Secondary | ICD-10-CM | POA: Diagnosis not present

## 2021-09-27 DIAGNOSIS — E039 Hypothyroidism, unspecified: Secondary | ICD-10-CM | POA: Diagnosis not present

## 2021-09-27 DIAGNOSIS — Z0289 Encounter for other administrative examinations: Secondary | ICD-10-CM | POA: Diagnosis not present

## 2021-09-27 DIAGNOSIS — M810 Age-related osteoporosis without current pathological fracture: Secondary | ICD-10-CM | POA: Diagnosis not present

## 2021-09-27 DIAGNOSIS — H35039 Hypertensive retinopathy, unspecified eye: Secondary | ICD-10-CM | POA: Diagnosis not present

## 2021-10-23 ENCOUNTER — Other Ambulatory Visit: Payer: Self-pay | Admitting: Neurology

## 2021-10-23 DIAGNOSIS — M810 Age-related osteoporosis without current pathological fracture: Secondary | ICD-10-CM | POA: Diagnosis not present

## 2021-11-06 ENCOUNTER — Other Ambulatory Visit: Payer: Self-pay | Admitting: Internal Medicine

## 2021-11-06 DIAGNOSIS — Z1231 Encounter for screening mammogram for malignant neoplasm of breast: Secondary | ICD-10-CM

## 2021-11-22 ENCOUNTER — Ambulatory Visit: Payer: Medicare PPO

## 2021-11-29 ENCOUNTER — Ambulatory Visit: Payer: Medicare PPO

## 2021-12-01 DIAGNOSIS — D3705 Neoplasm of uncertain behavior of pharynx: Secondary | ICD-10-CM | POA: Diagnosis not present

## 2021-12-28 DIAGNOSIS — E039 Hypothyroidism, unspecified: Secondary | ICD-10-CM | POA: Diagnosis not present

## 2021-12-28 DIAGNOSIS — E119 Type 2 diabetes mellitus without complications: Secondary | ICD-10-CM | POA: Diagnosis not present

## 2022-01-17 DIAGNOSIS — E039 Hypothyroidism, unspecified: Secondary | ICD-10-CM | POA: Diagnosis not present

## 2022-02-22 DIAGNOSIS — E119 Type 2 diabetes mellitus without complications: Secondary | ICD-10-CM | POA: Diagnosis not present

## 2022-02-22 DIAGNOSIS — H04123 Dry eye syndrome of bilateral lacrimal glands: Secondary | ICD-10-CM | POA: Diagnosis not present

## 2022-02-22 DIAGNOSIS — H26492 Other secondary cataract, left eye: Secondary | ICD-10-CM | POA: Diagnosis not present

## 2022-02-22 DIAGNOSIS — H35362 Drusen (degenerative) of macula, left eye: Secondary | ICD-10-CM | POA: Diagnosis not present

## 2022-03-01 DIAGNOSIS — E7800 Pure hypercholesterolemia, unspecified: Secondary | ICD-10-CM | POA: Diagnosis not present

## 2022-03-01 DIAGNOSIS — E039 Hypothyroidism, unspecified: Secondary | ICD-10-CM | POA: Diagnosis not present

## 2022-03-01 DIAGNOSIS — I10 Essential (primary) hypertension: Secondary | ICD-10-CM | POA: Diagnosis not present

## 2022-03-06 DIAGNOSIS — R9431 Abnormal electrocardiogram [ECG] [EKG]: Secondary | ICD-10-CM | POA: Diagnosis not present

## 2022-03-06 DIAGNOSIS — M810 Age-related osteoporosis without current pathological fracture: Secondary | ICD-10-CM | POA: Diagnosis not present

## 2022-03-06 DIAGNOSIS — I6523 Occlusion and stenosis of bilateral carotid arteries: Secondary | ICD-10-CM | POA: Diagnosis not present

## 2022-03-06 DIAGNOSIS — Z0000 Encounter for general adult medical examination without abnormal findings: Secondary | ICD-10-CM | POA: Diagnosis not present

## 2022-03-06 DIAGNOSIS — G2581 Restless legs syndrome: Secondary | ICD-10-CM | POA: Diagnosis not present

## 2022-03-06 DIAGNOSIS — I10 Essential (primary) hypertension: Secondary | ICD-10-CM | POA: Diagnosis not present

## 2022-04-23 DIAGNOSIS — M810 Age-related osteoporosis without current pathological fracture: Secondary | ICD-10-CM | POA: Diagnosis not present

## 2022-06-06 DIAGNOSIS — N1831 Chronic kidney disease, stage 3a: Secondary | ICD-10-CM | POA: Diagnosis not present

## 2022-06-14 DIAGNOSIS — E875 Hyperkalemia: Secondary | ICD-10-CM | POA: Diagnosis not present

## 2022-10-25 DIAGNOSIS — M810 Age-related osteoporosis without current pathological fracture: Secondary | ICD-10-CM | POA: Diagnosis not present

## 2022-10-30 ENCOUNTER — Ambulatory Visit: Payer: Medicare PPO | Admitting: Neurology

## 2022-10-30 ENCOUNTER — Other Ambulatory Visit: Payer: Self-pay | Admitting: Neurology

## 2022-10-30 ENCOUNTER — Encounter: Payer: Self-pay | Admitting: Neurology

## 2022-10-30 VITALS — BP 147/72 | HR 74 | Ht 66.0 in | Wt 153.0 lb

## 2022-10-30 DIAGNOSIS — M25531 Pain in right wrist: Secondary | ICD-10-CM | POA: Diagnosis not present

## 2022-10-30 DIAGNOSIS — G5601 Carpal tunnel syndrome, right upper limb: Secondary | ICD-10-CM

## 2022-10-30 MED ORDER — CELECOXIB 50 MG PO CAPS: 50.0000 mg | ORAL_CAPSULE | Freq: Two times a day (BID) | ORAL | 3 refills | Status: AC | PRN
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
## 2022-11-08 ENCOUNTER — Telehealth: Payer: Self-pay | Admitting: Neurology

## 2022-11-08 ENCOUNTER — Encounter: Payer: Self-pay | Admitting: Neurology

## 2022-11-08 NOTE — Telephone Encounter (Signed)
Pt states taking the gabapentin (NEURONTIN) 100 MG capsule , makes her unsteady and off balance, please call pt to discuss

## 2022-11-14 NOTE — Telephone Encounter (Signed)
I called and left message for patient and her daughter.

## 2022-11-14 NOTE — Telephone Encounter (Signed)
I called patient, she did try gabapentin higher dose 300 mg 3 times a day, complains of dizziness, worsening gait abnormality, since stopped daytime dose, her gait has back to baseline  She has right foot weakness, more than left, likely due to lumbar radiculopathy, was confirmed by previous abnormal MRI lumbar spine, EMG nerve conduction study, she had a ankle brace, did help her working, but it was cumbersome, she is not using it regularly  I have encouraged her to continue use right ankle brace,  She had a severe right carpal tunnel syndrome based on previous electrodiagnostic study September 2022,  Some improvement with right wrist splint, Voltaren gel, warm compression, if her symptoms continue progress, she will reach out, may consider repeat EMG nerve conduction study, she does not want to consider surgical decompression now,

## 2022-11-22 ENCOUNTER — Encounter: Payer: Self-pay | Admitting: Neurology

## 2022-12-17 DIAGNOSIS — H6691 Otitis media, unspecified, right ear: Secondary | ICD-10-CM | POA: Diagnosis not present

## 2022-12-17 DIAGNOSIS — H938X3 Other specified disorders of ear, bilateral: Secondary | ICD-10-CM | POA: Diagnosis not present

## 2023-01-11 DIAGNOSIS — E039 Hypothyroidism, unspecified: Secondary | ICD-10-CM | POA: Diagnosis not present

## 2023-01-18 DIAGNOSIS — E039 Hypothyroidism, unspecified: Secondary | ICD-10-CM | POA: Diagnosis not present

## 2023-01-18 DIAGNOSIS — E049 Nontoxic goiter, unspecified: Secondary | ICD-10-CM | POA: Diagnosis not present

## 2023-01-21 DIAGNOSIS — J310 Chronic rhinitis: Secondary | ICD-10-CM | POA: Diagnosis not present

## 2023-01-21 DIAGNOSIS — J343 Hypertrophy of nasal turbinates: Secondary | ICD-10-CM | POA: Diagnosis not present

## 2023-01-21 DIAGNOSIS — H9011 Conductive hearing loss, unilateral, right ear, with unrestricted hearing on the contralateral side: Secondary | ICD-10-CM | POA: Diagnosis not present

## 2023-01-21 DIAGNOSIS — H6521 Chronic serous otitis media, right ear: Secondary | ICD-10-CM | POA: Diagnosis not present

## 2023-01-21 DIAGNOSIS — H6981 Other specified disorders of Eustachian tube, right ear: Secondary | ICD-10-CM | POA: Diagnosis not present

## 2023-01-30 DIAGNOSIS — H9011 Conductive hearing loss, unilateral, right ear, with unrestricted hearing on the contralateral side: Secondary | ICD-10-CM | POA: Diagnosis not present

## 2023-01-30 DIAGNOSIS — H6981 Other specified disorders of Eustachian tube, right ear: Secondary | ICD-10-CM | POA: Diagnosis not present

## 2023-01-30 DIAGNOSIS — H6521 Chronic serous otitis media, right ear: Secondary | ICD-10-CM | POA: Diagnosis not present

## 2023-02-28 DIAGNOSIS — H903 Sensorineural hearing loss, bilateral: Secondary | ICD-10-CM | POA: Diagnosis not present

## 2023-02-28 DIAGNOSIS — H7201 Central perforation of tympanic membrane, right ear: Secondary | ICD-10-CM | POA: Diagnosis not present

## 2023-02-28 DIAGNOSIS — H6981 Other specified disorders of Eustachian tube, right ear: Secondary | ICD-10-CM | POA: Diagnosis not present

## 2023-03-01 DIAGNOSIS — H35013 Changes in retinal vascular appearance, bilateral: Secondary | ICD-10-CM | POA: Diagnosis not present

## 2023-03-01 DIAGNOSIS — H35362 Drusen (degenerative) of macula, left eye: Secondary | ICD-10-CM | POA: Diagnosis not present

## 2023-03-01 DIAGNOSIS — E119 Type 2 diabetes mellitus without complications: Secondary | ICD-10-CM | POA: Diagnosis not present

## 2023-03-01 DIAGNOSIS — H04123 Dry eye syndrome of bilateral lacrimal glands: Secondary | ICD-10-CM | POA: Diagnosis not present

## 2023-03-14 DIAGNOSIS — M810 Age-related osteoporosis without current pathological fracture: Secondary | ICD-10-CM | POA: Diagnosis not present

## 2023-03-14 DIAGNOSIS — I10 Essential (primary) hypertension: Secondary | ICD-10-CM | POA: Diagnosis not present

## 2023-03-21 DIAGNOSIS — Z0000 Encounter for general adult medical examination without abnormal findings: Secondary | ICD-10-CM | POA: Diagnosis not present

## 2023-03-21 DIAGNOSIS — Z853 Personal history of malignant neoplasm of breast: Secondary | ICD-10-CM | POA: Diagnosis not present

## 2023-03-21 DIAGNOSIS — M810 Age-related osteoporosis without current pathological fracture: Secondary | ICD-10-CM | POA: Diagnosis not present

## 2023-03-21 DIAGNOSIS — Z23 Encounter for immunization: Secondary | ICD-10-CM | POA: Diagnosis not present

## 2023-04-01 DIAGNOSIS — L821 Other seborrheic keratosis: Secondary | ICD-10-CM | POA: Diagnosis not present

## 2023-04-26 DIAGNOSIS — M810 Age-related osteoporosis without current pathological fracture: Secondary | ICD-10-CM | POA: Diagnosis not present

## 2023-06-24 DIAGNOSIS — R29898 Other symptoms and signs involving the musculoskeletal system: Secondary | ICD-10-CM | POA: Diagnosis not present

## 2023-06-24 DIAGNOSIS — Z23 Encounter for immunization: Secondary | ICD-10-CM | POA: Diagnosis not present

## 2023-06-24 DIAGNOSIS — M810 Age-related osteoporosis without current pathological fracture: Secondary | ICD-10-CM | POA: Diagnosis not present

## 2023-06-24 DIAGNOSIS — G5601 Carpal tunnel syndrome, right upper limb: Secondary | ICD-10-CM | POA: Diagnosis not present

## 2023-06-24 DIAGNOSIS — N1832 Chronic kidney disease, stage 3b: Secondary | ICD-10-CM | POA: Diagnosis not present

## 2023-07-03 DIAGNOSIS — M79604 Pain in right leg: Secondary | ICD-10-CM | POA: Diagnosis not present

## 2023-07-03 DIAGNOSIS — R531 Weakness: Secondary | ICD-10-CM | POA: Diagnosis not present

## 2023-07-09 DIAGNOSIS — M79604 Pain in right leg: Secondary | ICD-10-CM | POA: Diagnosis not present

## 2023-07-09 DIAGNOSIS — R531 Weakness: Secondary | ICD-10-CM | POA: Diagnosis not present

## 2023-07-12 DIAGNOSIS — R531 Weakness: Secondary | ICD-10-CM | POA: Diagnosis not present

## 2023-07-12 DIAGNOSIS — M79604 Pain in right leg: Secondary | ICD-10-CM | POA: Diagnosis not present

## 2023-07-16 DIAGNOSIS — R531 Weakness: Secondary | ICD-10-CM | POA: Diagnosis not present

## 2023-07-16 DIAGNOSIS — M79604 Pain in right leg: Secondary | ICD-10-CM | POA: Diagnosis not present

## 2023-07-18 DIAGNOSIS — M79604 Pain in right leg: Secondary | ICD-10-CM | POA: Diagnosis not present

## 2023-07-18 DIAGNOSIS — R531 Weakness: Secondary | ICD-10-CM | POA: Diagnosis not present

## 2023-07-23 DIAGNOSIS — M79604 Pain in right leg: Secondary | ICD-10-CM | POA: Diagnosis not present

## 2023-07-23 DIAGNOSIS — R531 Weakness: Secondary | ICD-10-CM | POA: Diagnosis not present

## 2023-07-25 DIAGNOSIS — M79604 Pain in right leg: Secondary | ICD-10-CM | POA: Diagnosis not present

## 2023-07-25 DIAGNOSIS — R531 Weakness: Secondary | ICD-10-CM | POA: Diagnosis not present

## 2023-07-30 DIAGNOSIS — R531 Weakness: Secondary | ICD-10-CM | POA: Diagnosis not present

## 2023-07-30 DIAGNOSIS — M79604 Pain in right leg: Secondary | ICD-10-CM | POA: Diagnosis not present

## 2023-08-01 DIAGNOSIS — M79604 Pain in right leg: Secondary | ICD-10-CM | POA: Diagnosis not present

## 2023-08-01 DIAGNOSIS — R531 Weakness: Secondary | ICD-10-CM | POA: Diagnosis not present

## 2023-08-06 DIAGNOSIS — R531 Weakness: Secondary | ICD-10-CM | POA: Diagnosis not present

## 2023-08-06 DIAGNOSIS — M79604 Pain in right leg: Secondary | ICD-10-CM | POA: Diagnosis not present

## 2023-08-08 DIAGNOSIS — M79604 Pain in right leg: Secondary | ICD-10-CM | POA: Diagnosis not present

## 2023-08-08 DIAGNOSIS — R531 Weakness: Secondary | ICD-10-CM | POA: Diagnosis not present

## 2023-08-13 DIAGNOSIS — M79604 Pain in right leg: Secondary | ICD-10-CM | POA: Diagnosis not present

## 2023-08-13 DIAGNOSIS — M1811 Unilateral primary osteoarthritis of first carpometacarpal joint, right hand: Secondary | ICD-10-CM | POA: Diagnosis not present

## 2023-08-13 DIAGNOSIS — G5601 Carpal tunnel syndrome, right upper limb: Secondary | ICD-10-CM | POA: Diagnosis not present

## 2023-08-13 DIAGNOSIS — R531 Weakness: Secondary | ICD-10-CM | POA: Diagnosis not present

## 2023-08-15 DIAGNOSIS — R531 Weakness: Secondary | ICD-10-CM | POA: Diagnosis not present

## 2023-08-15 DIAGNOSIS — M79604 Pain in right leg: Secondary | ICD-10-CM | POA: Diagnosis not present

## 2023-08-20 ENCOUNTER — Encounter (INDEPENDENT_AMBULATORY_CARE_PROVIDER_SITE_OTHER): Payer: Self-pay

## 2023-08-20 ENCOUNTER — Ambulatory Visit (INDEPENDENT_AMBULATORY_CARE_PROVIDER_SITE_OTHER): Payer: Medicare PPO | Admitting: Otolaryngology

## 2023-08-20 VITALS — Ht 66.0 in | Wt 152.0 lb

## 2023-08-20 DIAGNOSIS — M79604 Pain in right leg: Secondary | ICD-10-CM | POA: Diagnosis not present

## 2023-08-20 DIAGNOSIS — H6981 Other specified disorders of Eustachian tube, right ear: Secondary | ICD-10-CM | POA: Diagnosis not present

## 2023-08-20 DIAGNOSIS — J310 Chronic rhinitis: Secondary | ICD-10-CM

## 2023-08-20 DIAGNOSIS — R531 Weakness: Secondary | ICD-10-CM | POA: Diagnosis not present

## 2023-08-20 DIAGNOSIS — H7201 Central perforation of tympanic membrane, right ear: Secondary | ICD-10-CM

## 2023-08-22 DIAGNOSIS — M79604 Pain in right leg: Secondary | ICD-10-CM | POA: Diagnosis not present

## 2023-08-22 DIAGNOSIS — R531 Weakness: Secondary | ICD-10-CM | POA: Diagnosis not present

## 2023-08-24 DIAGNOSIS — J310 Chronic rhinitis: Secondary | ICD-10-CM | POA: Insufficient documentation

## 2023-08-24 DIAGNOSIS — H7201 Central perforation of tympanic membrane, right ear: Secondary | ICD-10-CM | POA: Insufficient documentation

## 2023-08-24 DIAGNOSIS — H6981 Other specified disorders of Eustachian tube, right ear: Secondary | ICD-10-CM | POA: Insufficient documentation

## 2023-08-24 NOTE — Progress Notes (Signed)
Patient ID: Gwendolyn Rowe, female   DOB: 08-Jul-1934, 89 y.o.   MRN: [id number]  Follow-up: Right middle ear effusion, eustachian tube dysfunction, hearing loss  HPI: The patient is an 89 year old female who returns today for her follow-up evaluation.  The patient was previously seen for right ear eustachian tube dysfunction, right middle ear effusion, and right ear conductive hearing loss.  She was also noted to have chronic rhinitis and nasal mucosal congestion.  She was treated with Flonase nasal spray and Valsalva exercise.  She also underwent right myringotomy and tube placement in April 2024.  The patient returns today reporting no significant hearing difficulty.  She still has occasional mild nasal congestion.  She denies any otalgia or otorrhea.    Exam: General: Communicates without difficulty, well nourished, no acute distress. Head: Normocephalic, no evidence injury, no tenderness, facial buttresses intact without stepoff. Face/sinus: No tenderness to palpation and percussion. Facial movement is normal and symmetric. Eyes: PERRL, EOMI. No scleral icterus, conjunctivae clear. Neuro: CN II exam reveals vision grossly intact. No nystagmus at any point of gaze. Ears: Auricles well formed without lesions. Ear canals are intact without mass or lesion. No erythema or edema is appreciated.  The right ear ventilating tube is in place and patent.  The left tympanic membrane is intact and mobile.  Nose: External evaluation reveals normal support and skin without lesions. Dorsum is intact. Anterior rhinoscopy reveals congested mucosa over anterior aspect of inferior turbinates and intact septum. No purulence noted. Oral:  The patient is noted to have a subcentimeter mucous retention cyst on the superior pole of the left tonsil. No surface ulceration or other suspicious features are noted today. Neck: Full range of motion without pain. There is no significant lymphadenopathy. No masses palpable. Thyroid bed  within normal limits to palpation. Parotid glands and submandibular glands equal bilaterally without mass. Trachea is midline. Neuro:  CN 2-12 grossly intact.   Assessment: 1.  The right ventilating tube is in place and patent.  No drainage is noted. 2.  Clinical right ear eustachian tube dysfunction. 4.  The patient's nasal mucosal congestion has decreased.  Plan: 1.  The physical exam findings are reviewed with the patient. 2.  Continue with Flonase nasal spray as needed. 3.  Dry ear precautions on the right side. 4.  The patient will return for reevaluation in 6 months.

## 2023-08-27 DIAGNOSIS — R531 Weakness: Secondary | ICD-10-CM | POA: Diagnosis not present

## 2023-08-27 DIAGNOSIS — M79604 Pain in right leg: Secondary | ICD-10-CM | POA: Diagnosis not present

## 2023-08-29 DIAGNOSIS — M79604 Pain in right leg: Secondary | ICD-10-CM | POA: Diagnosis not present

## 2023-08-29 DIAGNOSIS — R531 Weakness: Secondary | ICD-10-CM | POA: Diagnosis not present

## 2023-09-02 DIAGNOSIS — R531 Weakness: Secondary | ICD-10-CM | POA: Diagnosis not present

## 2023-09-02 DIAGNOSIS — M79604 Pain in right leg: Secondary | ICD-10-CM | POA: Diagnosis not present

## 2023-09-04 DIAGNOSIS — R531 Weakness: Secondary | ICD-10-CM | POA: Diagnosis not present

## 2023-09-04 DIAGNOSIS — M79604 Pain in right leg: Secondary | ICD-10-CM | POA: Diagnosis not present

## 2023-09-10 DIAGNOSIS — M79604 Pain in right leg: Secondary | ICD-10-CM | POA: Diagnosis not present

## 2023-09-10 DIAGNOSIS — R531 Weakness: Secondary | ICD-10-CM | POA: Diagnosis not present

## 2023-09-12 DIAGNOSIS — R531 Weakness: Secondary | ICD-10-CM | POA: Diagnosis not present

## 2023-09-12 DIAGNOSIS — M79604 Pain in right leg: Secondary | ICD-10-CM | POA: Diagnosis not present

## 2023-09-17 DIAGNOSIS — R531 Weakness: Secondary | ICD-10-CM | POA: Diagnosis not present

## 2023-09-17 DIAGNOSIS — M79604 Pain in right leg: Secondary | ICD-10-CM | POA: Diagnosis not present

## 2023-09-24 DIAGNOSIS — M79604 Pain in right leg: Secondary | ICD-10-CM | POA: Diagnosis not present

## 2023-09-24 DIAGNOSIS — R531 Weakness: Secondary | ICD-10-CM | POA: Diagnosis not present

## 2023-09-26 DIAGNOSIS — R531 Weakness: Secondary | ICD-10-CM | POA: Diagnosis not present

## 2023-09-26 DIAGNOSIS — M79604 Pain in right leg: Secondary | ICD-10-CM | POA: Diagnosis not present

## 2023-10-08 DIAGNOSIS — R531 Weakness: Secondary | ICD-10-CM | POA: Diagnosis not present

## 2023-10-08 DIAGNOSIS — M79604 Pain in right leg: Secondary | ICD-10-CM | POA: Diagnosis not present

## 2023-10-10 DIAGNOSIS — R531 Weakness: Secondary | ICD-10-CM | POA: Diagnosis not present

## 2023-10-10 DIAGNOSIS — M79604 Pain in right leg: Secondary | ICD-10-CM | POA: Diagnosis not present

## 2023-10-15 DIAGNOSIS — M79604 Pain in right leg: Secondary | ICD-10-CM | POA: Diagnosis not present

## 2023-10-15 DIAGNOSIS — R531 Weakness: Secondary | ICD-10-CM | POA: Diagnosis not present

## 2023-10-17 DIAGNOSIS — M79604 Pain in right leg: Secondary | ICD-10-CM | POA: Diagnosis not present

## 2023-10-17 DIAGNOSIS — R531 Weakness: Secondary | ICD-10-CM | POA: Diagnosis not present

## 2023-10-22 DIAGNOSIS — M79604 Pain in right leg: Secondary | ICD-10-CM | POA: Diagnosis not present

## 2023-10-22 DIAGNOSIS — R531 Weakness: Secondary | ICD-10-CM | POA: Diagnosis not present

## 2023-10-24 DIAGNOSIS — M79604 Pain in right leg: Secondary | ICD-10-CM | POA: Diagnosis not present

## 2023-10-24 DIAGNOSIS — R531 Weakness: Secondary | ICD-10-CM | POA: Diagnosis not present

## 2023-10-29 DIAGNOSIS — R531 Weakness: Secondary | ICD-10-CM | POA: Diagnosis not present

## 2023-10-29 DIAGNOSIS — M79604 Pain in right leg: Secondary | ICD-10-CM | POA: Diagnosis not present

## 2023-10-30 DIAGNOSIS — M810 Age-related osteoporosis without current pathological fracture: Secondary | ICD-10-CM | POA: Diagnosis not present

## 2023-10-31 DIAGNOSIS — R531 Weakness: Secondary | ICD-10-CM | POA: Diagnosis not present

## 2023-10-31 DIAGNOSIS — M79604 Pain in right leg: Secondary | ICD-10-CM | POA: Diagnosis not present

## 2023-11-05 DIAGNOSIS — R531 Weakness: Secondary | ICD-10-CM | POA: Diagnosis not present

## 2023-11-05 DIAGNOSIS — M79604 Pain in right leg: Secondary | ICD-10-CM | POA: Diagnosis not present

## 2023-11-07 DIAGNOSIS — R531 Weakness: Secondary | ICD-10-CM | POA: Diagnosis not present

## 2023-11-07 DIAGNOSIS — M79604 Pain in right leg: Secondary | ICD-10-CM | POA: Diagnosis not present

## 2023-11-12 DIAGNOSIS — R531 Weakness: Secondary | ICD-10-CM | POA: Diagnosis not present

## 2023-11-12 DIAGNOSIS — M79604 Pain in right leg: Secondary | ICD-10-CM | POA: Diagnosis not present

## 2023-11-14 DIAGNOSIS — M79604 Pain in right leg: Secondary | ICD-10-CM | POA: Diagnosis not present

## 2023-11-14 DIAGNOSIS — R531 Weakness: Secondary | ICD-10-CM | POA: Diagnosis not present

## 2023-11-21 DIAGNOSIS — M79604 Pain in right leg: Secondary | ICD-10-CM | POA: Diagnosis not present

## 2023-11-21 DIAGNOSIS — R531 Weakness: Secondary | ICD-10-CM | POA: Diagnosis not present

## 2023-11-26 DIAGNOSIS — R531 Weakness: Secondary | ICD-10-CM | POA: Diagnosis not present

## 2023-11-26 DIAGNOSIS — M79604 Pain in right leg: Secondary | ICD-10-CM | POA: Diagnosis not present

## 2023-11-28 DIAGNOSIS — R531 Weakness: Secondary | ICD-10-CM | POA: Diagnosis not present

## 2023-11-28 DIAGNOSIS — M79604 Pain in right leg: Secondary | ICD-10-CM | POA: Diagnosis not present

## 2023-12-03 ENCOUNTER — Telehealth: Payer: Self-pay | Admitting: *Deleted

## 2023-12-03 ENCOUNTER — Other Ambulatory Visit: Payer: Self-pay | Admitting: Neurology

## 2023-12-03 DIAGNOSIS — R531 Weakness: Secondary | ICD-10-CM | POA: Diagnosis not present

## 2023-12-03 DIAGNOSIS — M79604 Pain in right leg: Secondary | ICD-10-CM | POA: Diagnosis not present

## 2023-12-03 NOTE — Telephone Encounter (Signed)
 Received call from Selena Batten at [facility] asking about refill denial (not appropriate).  I stated we have not seen pt since 10/2022, will need appt for refills.  She will tell the pt to call.  We can refill until seen.  How is she taking gabapentin, had issue during daytime. 300mg  po at bedtime?

## 2023-12-05 DIAGNOSIS — M79604 Pain in right leg: Secondary | ICD-10-CM | POA: Diagnosis not present

## 2023-12-05 DIAGNOSIS — R531 Weakness: Secondary | ICD-10-CM | POA: Diagnosis not present

## 2023-12-10 DIAGNOSIS — R531 Weakness: Secondary | ICD-10-CM | POA: Diagnosis not present

## 2023-12-10 DIAGNOSIS — M79604 Pain in right leg: Secondary | ICD-10-CM | POA: Diagnosis not present

## 2023-12-23 ENCOUNTER — Telehealth: Payer: Self-pay | Admitting: Neurology

## 2023-12-23 MED ORDER — GABAPENTIN 100 MG PO CAPS: ORAL_CAPSULE | ORAL | 1 refills | Status: DC

## 2023-12-23 NOTE — Telephone Encounter (Signed)
 Patient request refill for gabapentin (NEURONTIN) 100 MG capsule send to [facility].  Patient scheduled appointment on 03/09/24 at 9:00 am

## 2023-12-24 DIAGNOSIS — R531 Weakness: Secondary | ICD-10-CM | POA: Diagnosis not present

## 2023-12-24 DIAGNOSIS — M79604 Pain in right leg: Secondary | ICD-10-CM | POA: Diagnosis not present

## 2023-12-26 DIAGNOSIS — M79604 Pain in right leg: Secondary | ICD-10-CM | POA: Diagnosis not present

## 2023-12-26 DIAGNOSIS — R531 Weakness: Secondary | ICD-10-CM | POA: Diagnosis not present

## 2023-12-31 DIAGNOSIS — M79604 Pain in right leg: Secondary | ICD-10-CM | POA: Diagnosis not present

## 2023-12-31 DIAGNOSIS — R531 Weakness: Secondary | ICD-10-CM | POA: Diagnosis not present

## 2024-01-02 DIAGNOSIS — M79604 Pain in right leg: Secondary | ICD-10-CM | POA: Diagnosis not present

## 2024-01-02 DIAGNOSIS — R531 Weakness: Secondary | ICD-10-CM | POA: Diagnosis not present

## 2024-01-07 DIAGNOSIS — R531 Weakness: Secondary | ICD-10-CM | POA: Diagnosis not present

## 2024-01-07 DIAGNOSIS — M79604 Pain in right leg: Secondary | ICD-10-CM | POA: Diagnosis not present

## 2024-01-09 DIAGNOSIS — M79604 Pain in right leg: Secondary | ICD-10-CM | POA: Diagnosis not present

## 2024-01-09 DIAGNOSIS — R531 Weakness: Secondary | ICD-10-CM | POA: Diagnosis not present

## 2024-01-14 DIAGNOSIS — E039 Hypothyroidism, unspecified: Secondary | ICD-10-CM | POA: Diagnosis not present

## 2024-01-14 DIAGNOSIS — M79604 Pain in right leg: Secondary | ICD-10-CM | POA: Diagnosis not present

## 2024-01-14 DIAGNOSIS — R531 Weakness: Secondary | ICD-10-CM | POA: Diagnosis not present

## 2024-01-16 DIAGNOSIS — M79604 Pain in right leg: Secondary | ICD-10-CM | POA: Diagnosis not present

## 2024-01-16 DIAGNOSIS — R531 Weakness: Secondary | ICD-10-CM | POA: Diagnosis not present

## 2024-01-21 DIAGNOSIS — R531 Weakness: Secondary | ICD-10-CM | POA: Diagnosis not present

## 2024-01-21 DIAGNOSIS — M79604 Pain in right leg: Secondary | ICD-10-CM | POA: Diagnosis not present

## 2024-01-21 DIAGNOSIS — E039 Hypothyroidism, unspecified: Secondary | ICD-10-CM | POA: Diagnosis not present

## 2024-01-23 DIAGNOSIS — M79604 Pain in right leg: Secondary | ICD-10-CM | POA: Diagnosis not present

## 2024-01-23 DIAGNOSIS — R531 Weakness: Secondary | ICD-10-CM | POA: Diagnosis not present

## 2024-01-28 DIAGNOSIS — M79604 Pain in right leg: Secondary | ICD-10-CM | POA: Diagnosis not present

## 2024-01-28 DIAGNOSIS — R531 Weakness: Secondary | ICD-10-CM | POA: Diagnosis not present

## 2024-01-30 DIAGNOSIS — R531 Weakness: Secondary | ICD-10-CM | POA: Diagnosis not present

## 2024-01-30 DIAGNOSIS — M79604 Pain in right leg: Secondary | ICD-10-CM | POA: Diagnosis not present

## 2024-02-04 DIAGNOSIS — M79604 Pain in right leg: Secondary | ICD-10-CM | POA: Diagnosis not present

## 2024-02-04 DIAGNOSIS — R531 Weakness: Secondary | ICD-10-CM | POA: Diagnosis not present

## 2024-02-06 DIAGNOSIS — R531 Weakness: Secondary | ICD-10-CM | POA: Diagnosis not present

## 2024-02-06 DIAGNOSIS — M79604 Pain in right leg: Secondary | ICD-10-CM | POA: Diagnosis not present

## 2024-02-11 DIAGNOSIS — R531 Weakness: Secondary | ICD-10-CM | POA: Diagnosis not present

## 2024-02-11 DIAGNOSIS — M79604 Pain in right leg: Secondary | ICD-10-CM | POA: Diagnosis not present

## 2024-02-13 DIAGNOSIS — R531 Weakness: Secondary | ICD-10-CM | POA: Diagnosis not present

## 2024-02-13 DIAGNOSIS — M79604 Pain in right leg: Secondary | ICD-10-CM | POA: Diagnosis not present

## 2024-02-18 ENCOUNTER — Encounter (INDEPENDENT_AMBULATORY_CARE_PROVIDER_SITE_OTHER): Payer: Self-pay | Admitting: Otolaryngology

## 2024-02-18 ENCOUNTER — Ambulatory Visit (INDEPENDENT_AMBULATORY_CARE_PROVIDER_SITE_OTHER): Payer: Medicare PPO | Admitting: Otolaryngology

## 2024-02-18 VITALS — BP 122/67 | HR 86

## 2024-02-18 DIAGNOSIS — R531 Weakness: Secondary | ICD-10-CM | POA: Diagnosis not present

## 2024-02-18 DIAGNOSIS — H7201 Central perforation of tympanic membrane, right ear: Secondary | ICD-10-CM

## 2024-02-18 DIAGNOSIS — J343 Hypertrophy of nasal turbinates: Secondary | ICD-10-CM

## 2024-02-18 DIAGNOSIS — J310 Chronic rhinitis: Secondary | ICD-10-CM

## 2024-02-18 DIAGNOSIS — R0981 Nasal congestion: Secondary | ICD-10-CM

## 2024-02-18 DIAGNOSIS — H6981 Other specified disorders of Eustachian tube, right ear: Secondary | ICD-10-CM | POA: Diagnosis not present

## 2024-02-18 DIAGNOSIS — M79604 Pain in right leg: Secondary | ICD-10-CM | POA: Diagnosis not present

## 2024-02-19 NOTE — Progress Notes (Signed)
 Patient ID: Gwendolyn Rowe, female   DOB: Feb 25, 1934, 89 y.o.   MRN: [id number]  Follow-up: Right ear eustachian tube dysfunction, right middle ear effusion  HPI: The patient is an 89 year old female who returns today for her follow-up evaluation.  She was previously seen for her right ear eustachian tube dysfunction and right middle ear effusion.  It resulted in right ear conductive hearing loss.  She underwent right myringotomy and tube placement in April 2024.  The patient returns today reporting improvement in her nasal congestion with the use of Flonase.  She denies any otalgia, otorrhea, vertigo, or hearing difficulty.  Exam: General: Communicates without difficulty, well nourished, no acute distress. Head: Normocephalic, no evidence injury, no tenderness, facial buttresses intact without stepoff. Face/sinus: No tenderness to palpation and percussion. Facial movement is normal and symmetric. Eyes: PERRL, EOMI. No scleral icterus, conjunctivae clear. Neuro: CN II exam reveals vision grossly intact.  No nystagmus at any point of gaze. Ears: Auricles well formed without lesions.  The right ear ventilating tube is in place and patent.  The left tympanic membrane is normal.  Nose: External evaluation reveals normal support and skin without lesions.  Dorsum is intact.  Anterior rhinoscopy reveals congested mucosa over anterior aspect of inferior turbinates and intact septum.  No purulence noted. Oral:  Oral cavity and oropharynx are intact, symmetric, without erythema or edema.  Mucosa is moist without lesions. Neck: Full range of motion without pain.  There is no significant lymphadenopathy.  No masses palpable.  Thyroid  bed within normal limits to palpation.  Parotid glands and submandibular glands equal bilaterally without mass.  Trachea is midline. Neuro:  CN 2-12 grossly intact.   Assessment: 1.  The right ear ventilating tube is in place and patent.  No drainage is noted. 2.  Clinically improved  right ear eustachian tube dysfunction. 3.  Chronic rhinitis with nasal mucosal congestion and bilateral inferior turbinate hypertrophy.  The severity of her nasal congestion has decreased.  Plan: 1.  The physical exam findings are reviewed with the patient. 2.  Continue with dry ear precautions on the right side. 3.  Flonase nasal spray as needed. 4.  The patient will return for reevaluation in 6 months.

## 2024-02-20 DIAGNOSIS — M79604 Pain in right leg: Secondary | ICD-10-CM | POA: Diagnosis not present

## 2024-02-20 DIAGNOSIS — R531 Weakness: Secondary | ICD-10-CM | POA: Diagnosis not present

## 2024-02-25 DIAGNOSIS — R531 Weakness: Secondary | ICD-10-CM | POA: Diagnosis not present

## 2024-02-25 DIAGNOSIS — M79604 Pain in right leg: Secondary | ICD-10-CM | POA: Diagnosis not present

## 2024-02-27 DIAGNOSIS — R531 Weakness: Secondary | ICD-10-CM | POA: Diagnosis not present

## 2024-02-27 DIAGNOSIS — M79604 Pain in right leg: Secondary | ICD-10-CM | POA: Diagnosis not present

## 2024-03-03 DIAGNOSIS — H35363 Drusen (degenerative) of macula, bilateral: Secondary | ICD-10-CM | POA: Diagnosis not present

## 2024-03-03 DIAGNOSIS — R531 Weakness: Secondary | ICD-10-CM | POA: Diagnosis not present

## 2024-03-03 DIAGNOSIS — R7309 Other abnormal glucose: Secondary | ICD-10-CM | POA: Diagnosis not present

## 2024-03-03 DIAGNOSIS — H35373 Puckering of macula, bilateral: Secondary | ICD-10-CM | POA: Diagnosis not present

## 2024-03-03 DIAGNOSIS — M79604 Pain in right leg: Secondary | ICD-10-CM | POA: Diagnosis not present

## 2024-03-05 DIAGNOSIS — M79604 Pain in right leg: Secondary | ICD-10-CM | POA: Diagnosis not present

## 2024-03-05 DIAGNOSIS — R531 Weakness: Secondary | ICD-10-CM | POA: Diagnosis not present

## 2024-03-09 ENCOUNTER — Encounter: Payer: Self-pay | Admitting: Neurology

## 2024-03-09 ENCOUNTER — Ambulatory Visit: Admitting: Neurology

## 2024-03-09 VITALS — BP 133/74 | HR 76 | Resp 15 | Ht 66.0 in

## 2024-03-09 DIAGNOSIS — G5601 Carpal tunnel syndrome, right upper limb: Secondary | ICD-10-CM

## 2024-03-09 MED ORDER — GABAPENTIN 100 MG PO CAPS: 100.0000 mg | ORAL_CAPSULE | Freq: Three times a day (TID) | ORAL | 3 refills | Status: AC | PRN
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
## 2024-03-10 DIAGNOSIS — R531 Weakness: Secondary | ICD-10-CM | POA: Diagnosis not present

## 2024-03-10 DIAGNOSIS — M79604 Pain in right leg: Secondary | ICD-10-CM | POA: Diagnosis not present

## 2024-03-12 DIAGNOSIS — M79604 Pain in right leg: Secondary | ICD-10-CM | POA: Diagnosis not present

## 2024-03-12 DIAGNOSIS — R531 Weakness: Secondary | ICD-10-CM | POA: Diagnosis not present

## 2024-03-17 DIAGNOSIS — R531 Weakness: Secondary | ICD-10-CM | POA: Diagnosis not present

## 2024-03-17 DIAGNOSIS — M79604 Pain in right leg: Secondary | ICD-10-CM | POA: Diagnosis not present

## 2024-03-19 DIAGNOSIS — M810 Age-related osteoporosis without current pathological fracture: Secondary | ICD-10-CM | POA: Diagnosis not present

## 2024-03-19 DIAGNOSIS — R531 Weakness: Secondary | ICD-10-CM | POA: Diagnosis not present

## 2024-03-19 DIAGNOSIS — I10 Essential (primary) hypertension: Secondary | ICD-10-CM | POA: Diagnosis not present

## 2024-03-19 DIAGNOSIS — R7303 Prediabetes: Secondary | ICD-10-CM | POA: Diagnosis not present

## 2024-03-19 DIAGNOSIS — M79604 Pain in right leg: Secondary | ICD-10-CM | POA: Diagnosis not present

## 2024-03-24 DIAGNOSIS — R531 Weakness: Secondary | ICD-10-CM | POA: Diagnosis not present

## 2024-03-24 DIAGNOSIS — I10 Essential (primary) hypertension: Secondary | ICD-10-CM | POA: Diagnosis not present

## 2024-03-24 DIAGNOSIS — M79604 Pain in right leg: Secondary | ICD-10-CM | POA: Diagnosis not present

## 2024-03-24 DIAGNOSIS — M810 Age-related osteoporosis without current pathological fracture: Secondary | ICD-10-CM | POA: Diagnosis not present

## 2024-03-24 DIAGNOSIS — E039 Hypothyroidism, unspecified: Secondary | ICD-10-CM | POA: Diagnosis not present

## 2024-03-24 DIAGNOSIS — Z0000 Encounter for general adult medical examination without abnormal findings: Secondary | ICD-10-CM | POA: Diagnosis not present

## 2024-03-24 DIAGNOSIS — Z853 Personal history of malignant neoplasm of breast: Secondary | ICD-10-CM | POA: Diagnosis not present

## 2024-03-26 DIAGNOSIS — M79604 Pain in right leg: Secondary | ICD-10-CM | POA: Diagnosis not present

## 2024-03-26 DIAGNOSIS — R531 Weakness: Secondary | ICD-10-CM | POA: Diagnosis not present

## 2024-03-31 DIAGNOSIS — R531 Weakness: Secondary | ICD-10-CM | POA: Diagnosis not present

## 2024-03-31 DIAGNOSIS — M79604 Pain in right leg: Secondary | ICD-10-CM | POA: Diagnosis not present

## 2024-04-02 DIAGNOSIS — M79604 Pain in right leg: Secondary | ICD-10-CM | POA: Diagnosis not present

## 2024-04-02 DIAGNOSIS — R531 Weakness: Secondary | ICD-10-CM | POA: Diagnosis not present

## 2024-04-07 DIAGNOSIS — R531 Weakness: Secondary | ICD-10-CM | POA: Diagnosis not present

## 2024-04-07 DIAGNOSIS — M79604 Pain in right leg: Secondary | ICD-10-CM | POA: Diagnosis not present

## 2024-04-14 DIAGNOSIS — M79604 Pain in right leg: Secondary | ICD-10-CM | POA: Diagnosis not present

## 2024-04-14 DIAGNOSIS — R531 Weakness: Secondary | ICD-10-CM | POA: Diagnosis not present

## 2024-04-16 DIAGNOSIS — R531 Weakness: Secondary | ICD-10-CM | POA: Diagnosis not present

## 2024-04-16 DIAGNOSIS — M79604 Pain in right leg: Secondary | ICD-10-CM | POA: Diagnosis not present

## 2024-04-21 DIAGNOSIS — M79604 Pain in right leg: Secondary | ICD-10-CM | POA: Diagnosis not present

## 2024-04-21 DIAGNOSIS — R531 Weakness: Secondary | ICD-10-CM | POA: Diagnosis not present

## 2024-04-23 DIAGNOSIS — R531 Weakness: Secondary | ICD-10-CM | POA: Diagnosis not present

## 2024-04-23 DIAGNOSIS — M79604 Pain in right leg: Secondary | ICD-10-CM | POA: Diagnosis not present

## 2024-04-28 DIAGNOSIS — R531 Weakness: Secondary | ICD-10-CM | POA: Diagnosis not present

## 2024-04-28 DIAGNOSIS — M79604 Pain in right leg: Secondary | ICD-10-CM | POA: Diagnosis not present

## 2024-04-29 DIAGNOSIS — M810 Age-related osteoporosis without current pathological fracture: Secondary | ICD-10-CM | POA: Diagnosis not present

## 2024-04-30 DIAGNOSIS — M79604 Pain in right leg: Secondary | ICD-10-CM | POA: Diagnosis not present

## 2024-04-30 DIAGNOSIS — R531 Weakness: Secondary | ICD-10-CM | POA: Diagnosis not present

## 2024-05-07 DIAGNOSIS — M79604 Pain in right leg: Secondary | ICD-10-CM | POA: Diagnosis not present

## 2024-05-07 DIAGNOSIS — R531 Weakness: Secondary | ICD-10-CM | POA: Diagnosis not present

## 2024-05-12 DIAGNOSIS — M79604 Pain in right leg: Secondary | ICD-10-CM | POA: Diagnosis not present

## 2024-05-12 DIAGNOSIS — R531 Weakness: Secondary | ICD-10-CM | POA: Diagnosis not present

## 2024-05-14 DIAGNOSIS — R531 Weakness: Secondary | ICD-10-CM | POA: Diagnosis not present

## 2024-05-14 DIAGNOSIS — M79604 Pain in right leg: Secondary | ICD-10-CM | POA: Diagnosis not present

## 2024-05-19 DIAGNOSIS — R531 Weakness: Secondary | ICD-10-CM | POA: Diagnosis not present

## 2024-05-19 DIAGNOSIS — M79604 Pain in right leg: Secondary | ICD-10-CM | POA: Diagnosis not present

## 2024-05-21 DIAGNOSIS — M79604 Pain in right leg: Secondary | ICD-10-CM | POA: Diagnosis not present

## 2024-05-21 DIAGNOSIS — R531 Weakness: Secondary | ICD-10-CM | POA: Diagnosis not present

## 2024-05-26 DIAGNOSIS — R531 Weakness: Secondary | ICD-10-CM | POA: Diagnosis not present

## 2024-05-26 DIAGNOSIS — M79604 Pain in right leg: Secondary | ICD-10-CM | POA: Diagnosis not present

## 2024-05-28 DIAGNOSIS — M79604 Pain in right leg: Secondary | ICD-10-CM | POA: Diagnosis not present

## 2024-05-28 DIAGNOSIS — R531 Weakness: Secondary | ICD-10-CM | POA: Diagnosis not present

## 2024-06-02 DIAGNOSIS — M79604 Pain in right leg: Secondary | ICD-10-CM | POA: Diagnosis not present

## 2024-06-02 DIAGNOSIS — R531 Weakness: Secondary | ICD-10-CM | POA: Diagnosis not present

## 2024-06-04 DIAGNOSIS — M79604 Pain in right leg: Secondary | ICD-10-CM | POA: Diagnosis not present

## 2024-06-04 DIAGNOSIS — R531 Weakness: Secondary | ICD-10-CM | POA: Diagnosis not present

## 2024-06-11 DIAGNOSIS — M79604 Pain in right leg: Secondary | ICD-10-CM | POA: Diagnosis not present

## 2024-06-11 DIAGNOSIS — R531 Weakness: Secondary | ICD-10-CM | POA: Diagnosis not present

## 2024-06-16 DIAGNOSIS — M79604 Pain in right leg: Secondary | ICD-10-CM | POA: Diagnosis not present

## 2024-06-16 DIAGNOSIS — R531 Weakness: Secondary | ICD-10-CM | POA: Diagnosis not present

## 2024-06-18 DIAGNOSIS — R531 Weakness: Secondary | ICD-10-CM | POA: Diagnosis not present

## 2024-06-18 DIAGNOSIS — M79604 Pain in right leg: Secondary | ICD-10-CM | POA: Diagnosis not present

## 2024-06-23 DIAGNOSIS — M79604 Pain in right leg: Secondary | ICD-10-CM | POA: Diagnosis not present

## 2024-06-23 DIAGNOSIS — R531 Weakness: Secondary | ICD-10-CM | POA: Diagnosis not present

## 2024-06-25 DIAGNOSIS — R531 Weakness: Secondary | ICD-10-CM | POA: Diagnosis not present

## 2024-06-25 DIAGNOSIS — M79604 Pain in right leg: Secondary | ICD-10-CM | POA: Diagnosis not present

## 2024-06-30 DIAGNOSIS — R531 Weakness: Secondary | ICD-10-CM | POA: Diagnosis not present

## 2024-06-30 DIAGNOSIS — M79604 Pain in right leg: Secondary | ICD-10-CM | POA: Diagnosis not present

## 2024-07-02 DIAGNOSIS — M79604 Pain in right leg: Secondary | ICD-10-CM | POA: Diagnosis not present

## 2024-07-02 DIAGNOSIS — R531 Weakness: Secondary | ICD-10-CM | POA: Diagnosis not present

## 2024-07-07 DIAGNOSIS — M79604 Pain in right leg: Secondary | ICD-10-CM | POA: Diagnosis not present

## 2024-07-07 DIAGNOSIS — R531 Weakness: Secondary | ICD-10-CM | POA: Diagnosis not present

## 2024-07-09 DIAGNOSIS — R531 Weakness: Secondary | ICD-10-CM | POA: Diagnosis not present

## 2024-07-09 DIAGNOSIS — M79604 Pain in right leg: Secondary | ICD-10-CM | POA: Diagnosis not present

## 2024-07-14 DIAGNOSIS — R531 Weakness: Secondary | ICD-10-CM | POA: Diagnosis not present

## 2024-07-14 DIAGNOSIS — M79604 Pain in right leg: Secondary | ICD-10-CM | POA: Diagnosis not present

## 2024-07-28 DIAGNOSIS — M79604 Pain in right leg: Secondary | ICD-10-CM | POA: Diagnosis not present

## 2024-07-28 DIAGNOSIS — R531 Weakness: Secondary | ICD-10-CM | POA: Diagnosis not present

## 2024-07-30 DIAGNOSIS — M79604 Pain in right leg: Secondary | ICD-10-CM | POA: Diagnosis not present

## 2024-07-30 DIAGNOSIS — R531 Weakness: Secondary | ICD-10-CM | POA: Diagnosis not present

## 2024-08-04 DIAGNOSIS — M79604 Pain in right leg: Secondary | ICD-10-CM | POA: Diagnosis not present

## 2024-08-04 DIAGNOSIS — R531 Weakness: Secondary | ICD-10-CM | POA: Diagnosis not present

## 2024-08-06 DIAGNOSIS — M79604 Pain in right leg: Secondary | ICD-10-CM | POA: Diagnosis not present

## 2024-08-06 DIAGNOSIS — R531 Weakness: Secondary | ICD-10-CM | POA: Diagnosis not present

## 2024-08-11 DIAGNOSIS — R531 Weakness: Secondary | ICD-10-CM | POA: Diagnosis not present

## 2024-08-11 DIAGNOSIS — M79604 Pain in right leg: Secondary | ICD-10-CM | POA: Diagnosis not present

## 2024-08-12 DIAGNOSIS — G5601 Carpal tunnel syndrome, right upper limb: Secondary | ICD-10-CM | POA: Diagnosis not present

## 2024-08-12 DIAGNOSIS — M1811 Unilateral primary osteoarthritis of first carpometacarpal joint, right hand: Secondary | ICD-10-CM | POA: Diagnosis not present

## 2024-08-12 DIAGNOSIS — M79641 Pain in right hand: Secondary | ICD-10-CM | POA: Diagnosis not present

## 2024-08-13 DIAGNOSIS — M79604 Pain in right leg: Secondary | ICD-10-CM | POA: Diagnosis not present

## 2024-08-13 DIAGNOSIS — R531 Weakness: Secondary | ICD-10-CM | POA: Diagnosis not present

## 2024-08-18 DIAGNOSIS — M79604 Pain in right leg: Secondary | ICD-10-CM | POA: Diagnosis not present

## 2024-08-18 DIAGNOSIS — R531 Weakness: Secondary | ICD-10-CM | POA: Diagnosis not present

## 2024-08-27 DIAGNOSIS — R531 Weakness: Secondary | ICD-10-CM | POA: Diagnosis not present

## 2024-08-27 DIAGNOSIS — M79604 Pain in right leg: Secondary | ICD-10-CM | POA: Diagnosis not present

## 2024-09-01 ENCOUNTER — Encounter (INDEPENDENT_AMBULATORY_CARE_PROVIDER_SITE_OTHER): Payer: Self-pay | Admitting: Otolaryngology

## 2024-09-01 ENCOUNTER — Ambulatory Visit (INDEPENDENT_AMBULATORY_CARE_PROVIDER_SITE_OTHER): Admitting: Otolaryngology

## 2024-09-01 VITALS — BP 132/75 | HR 73 | Temp 97.9°F | Ht 66.0 in | Wt 150.0 lb

## 2024-09-01 DIAGNOSIS — J343 Hypertrophy of nasal turbinates: Secondary | ICD-10-CM

## 2024-09-01 DIAGNOSIS — J310 Chronic rhinitis: Secondary | ICD-10-CM

## 2024-09-01 DIAGNOSIS — H6981 Other specified disorders of Eustachian tube, right ear: Secondary | ICD-10-CM | POA: Diagnosis not present

## 2024-09-01 DIAGNOSIS — M79604 Pain in right leg: Secondary | ICD-10-CM | POA: Diagnosis not present

## 2024-09-01 DIAGNOSIS — Z9622 Myringotomy tube(s) status: Secondary | ICD-10-CM | POA: Diagnosis not present

## 2024-09-01 DIAGNOSIS — R0981 Nasal congestion: Secondary | ICD-10-CM | POA: Diagnosis not present

## 2024-09-01 DIAGNOSIS — H7201 Central perforation of tympanic membrane, right ear: Secondary | ICD-10-CM

## 2024-09-01 DIAGNOSIS — R531 Weakness: Secondary | ICD-10-CM | POA: Diagnosis not present

## 2024-09-01 NOTE — Progress Notes (Unsigned)
 Patient ID: Gwendolyn Rowe, female   DOB: 1934-02-04, 89 y.o.   MRN: [id number]  Follow up: Right eustachian tube dysfunction, right middle ear effusion, right ventilating tube placement  Discussed the use of AI scribe software for clinical note transcription with the patient, who gave verbal consent to proceed.  History of Present Illness Gwendolyn Rowe is an 89 year old female who presents for follow-up of her right ear eustachian tube dysfunction.  She has not experienced significant issues with her right ear recently, although she has had some sinus congestion on the right side, described as 'kind of clogged up'. The ear itself has not been significantly bothersome.  She has been using Flonase for her symptoms but is currently out of it. She recalls having fluid and eustachian tube dysfunction on the right side, which necessitated the placement of a tube last year. Her upper back teeth were previously bothering her, but this has since resolved.  She reports nasal congestion but no significant issues with the ear itself.   Exam: General: Communicates without difficulty, well nourished, no acute distress. Head: Normocephalic, no evidence injury, no tenderness, facial buttresses intact without stepoff. Face/sinus: No tenderness to palpation and percussion. Facial movement is normal and symmetric. Eyes: PERRL, EOMI. No scleral icterus, conjunctivae clear. Neuro: CN II exam reveals vision grossly intact.  No nystagmus at any point of gaze. Ears: Auricles well formed without lesions.  Ear canals are intact without mass or lesion.  No erythema or edema is appreciated.  The right ventilating tube is in place and patent.  Nose: External evaluation reveals normal support and skin without lesions.  Dorsum is intact.  Anterior rhinoscopy reveals congested mucosa over anterior aspect of inferior turbinates and intact septum.  No purulence noted. Oral:  Oral cavity and oropharynx are intact, symmetric,  without erythema or edema.  Mucosa is moist without lesions. Neck: Full range of motion without pain.  There is no significant lymphadenopathy.  No masses palpable.  Thyroid  bed within normal limits to palpation.  Parotid glands and submandibular glands equal bilaterally without mass.  Trachea is midline. Neuro:  CN 2-12 grossly intact.    Assessment and Plan Assessment & Plan Right ear eustachian tube dysfunction status post tympanostomy tube placement The tympanostomy tube placed in April 2024 is functioning well with no fluid or drainage.  - Continue regular checks every six months until the tube extrudes.  Chronic rhinitis with nasal mucosal congestion and bilateral inferior turbinate hypertrophy Nasal congestion is likely due to pollen exposure, causing swollen turbinates and affecting the eustachian tube, especially on the right side. Flonase nasal spray is recommended to manage symptoms. - Use Flonase nasal spray daily when experiencing facial, sinus, or ear congestion.

## 2024-09-07 DIAGNOSIS — M79604 Pain in right leg: Secondary | ICD-10-CM | POA: Diagnosis not present

## 2024-09-07 DIAGNOSIS — R531 Weakness: Secondary | ICD-10-CM | POA: Diagnosis not present

## 2024-09-10 DIAGNOSIS — R531 Weakness: Secondary | ICD-10-CM | POA: Diagnosis not present

## 2024-09-10 DIAGNOSIS — M79604 Pain in right leg: Secondary | ICD-10-CM | POA: Diagnosis not present

## 2024-09-15 DIAGNOSIS — R531 Weakness: Secondary | ICD-10-CM | POA: Diagnosis not present

## 2024-09-15 DIAGNOSIS — M79604 Pain in right leg: Secondary | ICD-10-CM | POA: Diagnosis not present

## 2025-03-03 ENCOUNTER — Ambulatory Visit (INDEPENDENT_AMBULATORY_CARE_PROVIDER_SITE_OTHER): Admitting: Otolaryngology
